# Patient Record
Sex: Female | Born: 1980 | Race: White | Hispanic: No | Smoking: Never smoker
Health system: Southern US, Community
[De-identification: ages and names within clinical notes are randomized; demographics above are authoritative.]

## PROBLEM LIST (undated history)

## (undated) DIAGNOSIS — F32A Depression, unspecified: Secondary | ICD-10-CM

## (undated) DIAGNOSIS — F329 Major depressive disorder, single episode, unspecified: Secondary | ICD-10-CM

## (undated) HISTORY — DX: Depression, unspecified: F32.A

## (undated) HISTORY — DX: Major depressive disorder, single episode, unspecified: F32.9

---

## 2005-10-20 ENCOUNTER — Emergency Department: Payer: Self-pay | Admitting: Emergency Medicine

## 2007-04-18 ENCOUNTER — Emergency Department: Payer: Self-pay | Admitting: Unknown Physician Specialty

## 2007-04-18 ENCOUNTER — Other Ambulatory Visit: Payer: Self-pay

## 2007-06-18 ENCOUNTER — Emergency Department: Payer: Self-pay | Admitting: Emergency Medicine

## 2007-11-28 ENCOUNTER — Ambulatory Visit: Payer: Self-pay | Admitting: Internal Medicine

## 2008-09-15 ENCOUNTER — Emergency Department: Payer: Self-pay | Admitting: Emergency Medicine

## 2008-11-05 ENCOUNTER — Ambulatory Visit: Payer: Self-pay | Admitting: Internal Medicine

## 2008-12-25 ENCOUNTER — Encounter: Payer: Self-pay | Admitting: Obstetrics and Gynecology

## 2009-02-12 LAB — HM HIV SCREENING LAB: HM HIV Screening: NEGATIVE

## 2009-04-02 ENCOUNTER — Ambulatory Visit: Payer: Self-pay | Admitting: Internal Medicine

## 2009-04-24 ENCOUNTER — Ambulatory Visit: Payer: Self-pay | Admitting: Family Medicine

## 2009-05-03 ENCOUNTER — Inpatient Hospital Stay: Payer: Self-pay | Admitting: Obstetrics & Gynecology

## 2009-11-10 ENCOUNTER — Emergency Department: Payer: Self-pay | Admitting: Emergency Medicine

## 2009-12-02 ENCOUNTER — Emergency Department: Payer: Self-pay | Admitting: Emergency Medicine

## 2010-03-27 ENCOUNTER — Emergency Department: Payer: Self-pay | Admitting: Unknown Physician Specialty

## 2010-03-30 IMAGING — US US OB DETAIL+14 WK - NRPT [facility]
1 series · 14 of 28 positions shown · non-contrast
Comparison: none

[Series 1: us ob detail+14 wk - nrpt [facility] · 0.29mm/px · 14 of 57 slices shown]
[im 3/57]
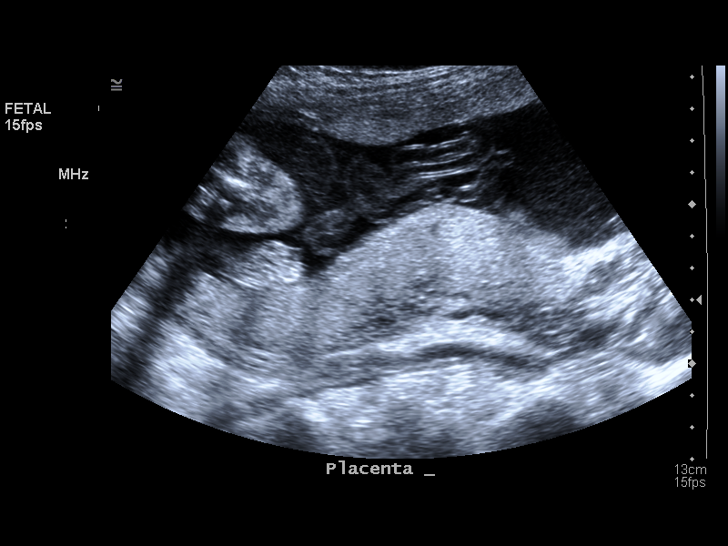
[im 7/57]
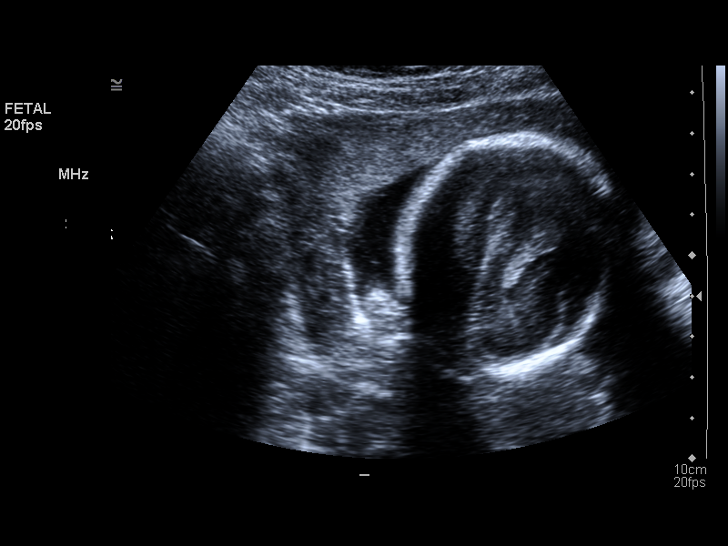
[im 11/57]
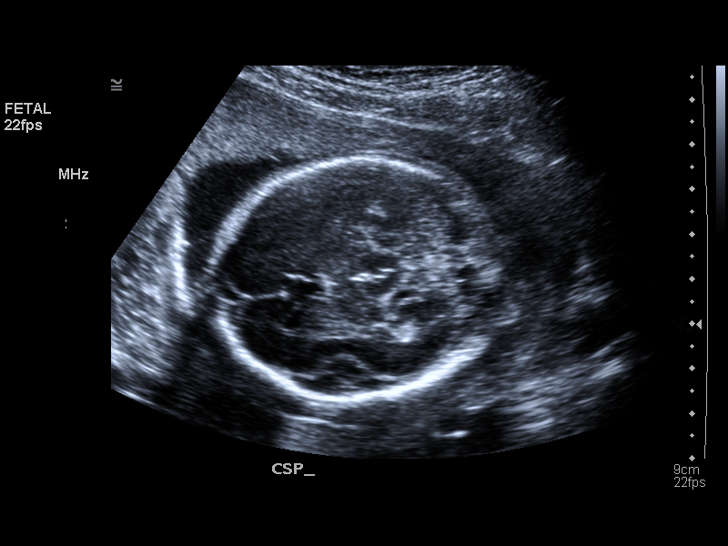
[im 15/57]
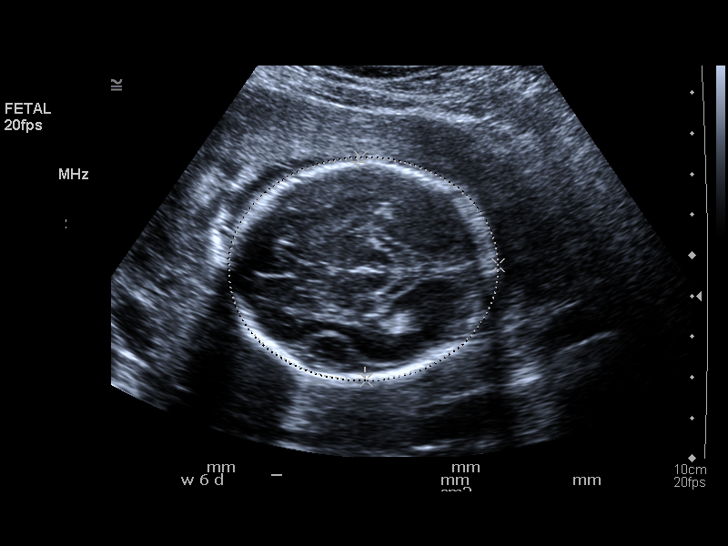
[im 19/57]
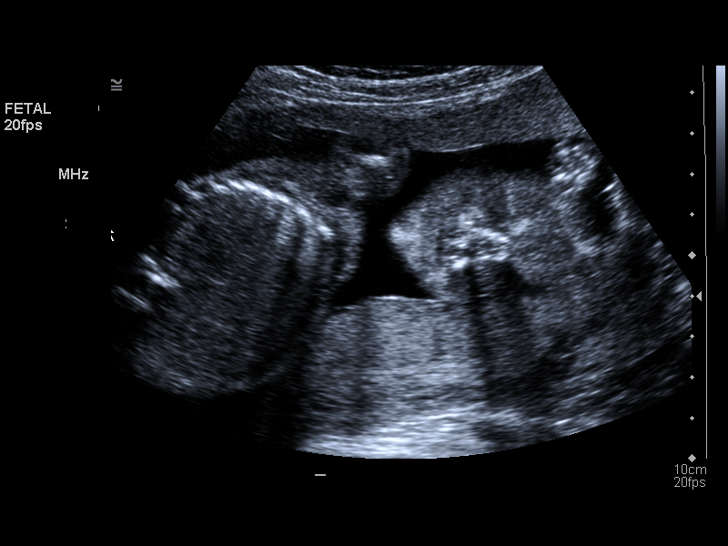
[im 23/57]
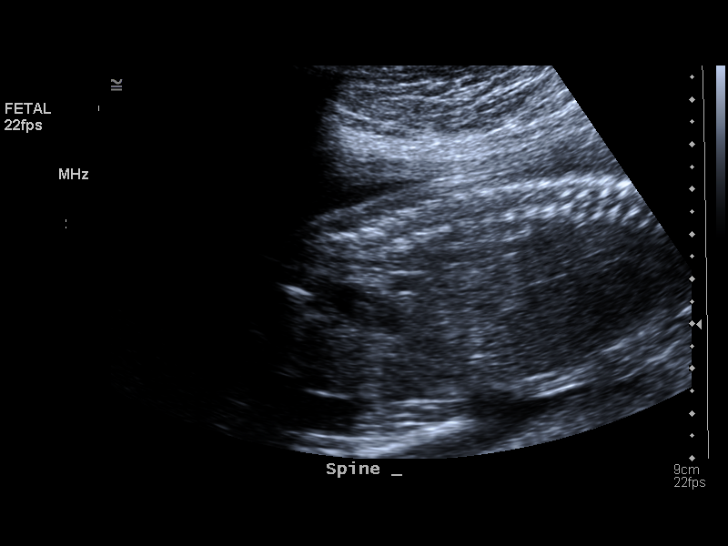
[im 27/57]
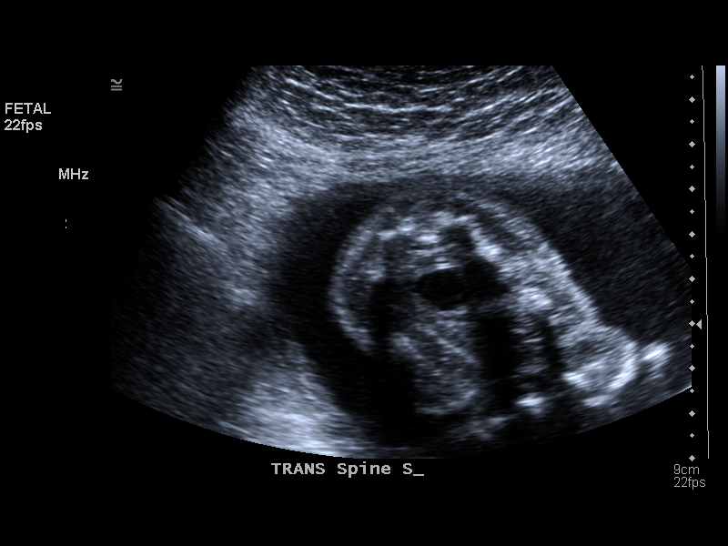
[im 32/57]
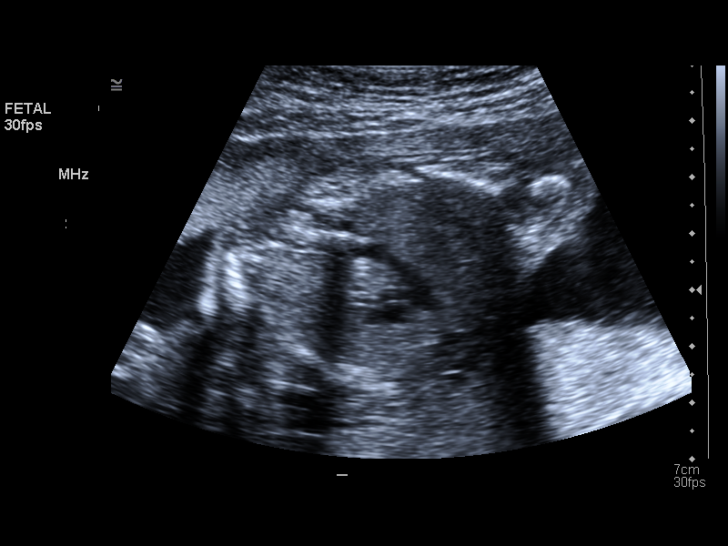
[im 36/57]
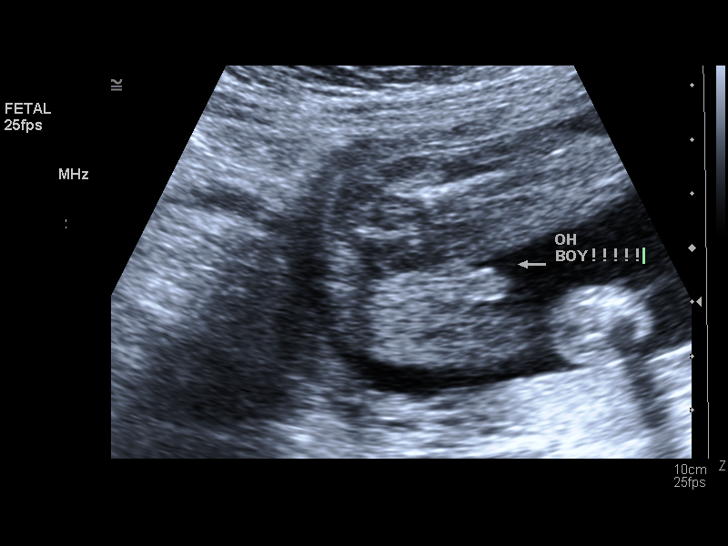
[im 40/57]
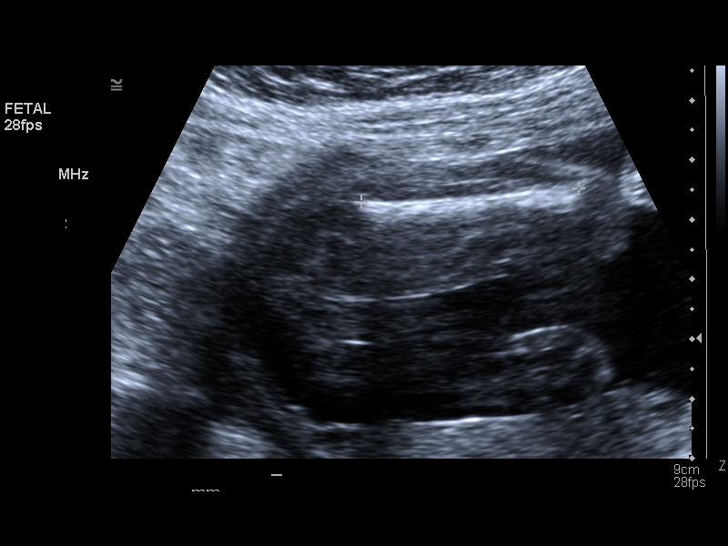
[im 44/57]
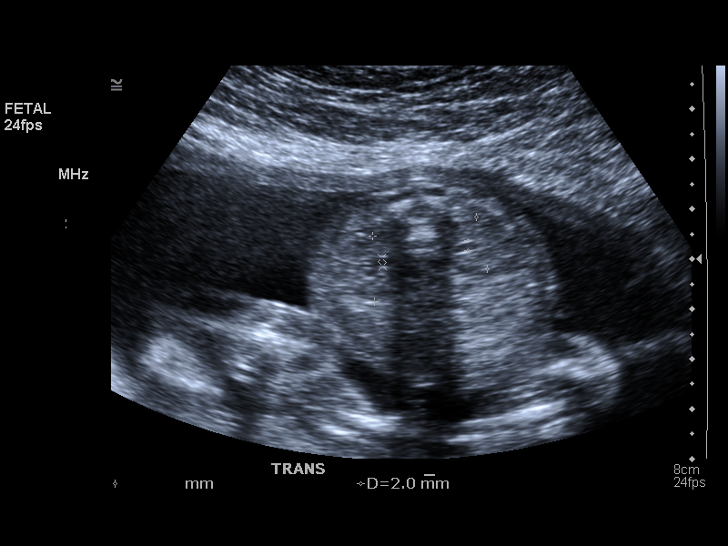
[im 48/57]
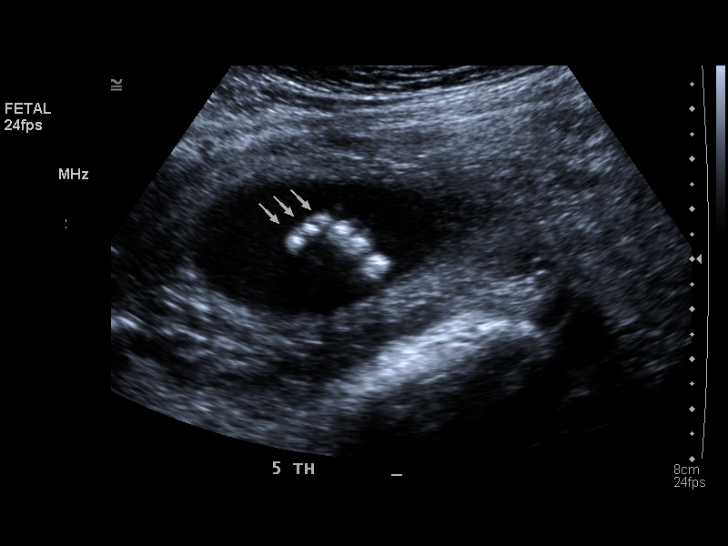
[im 52/57]
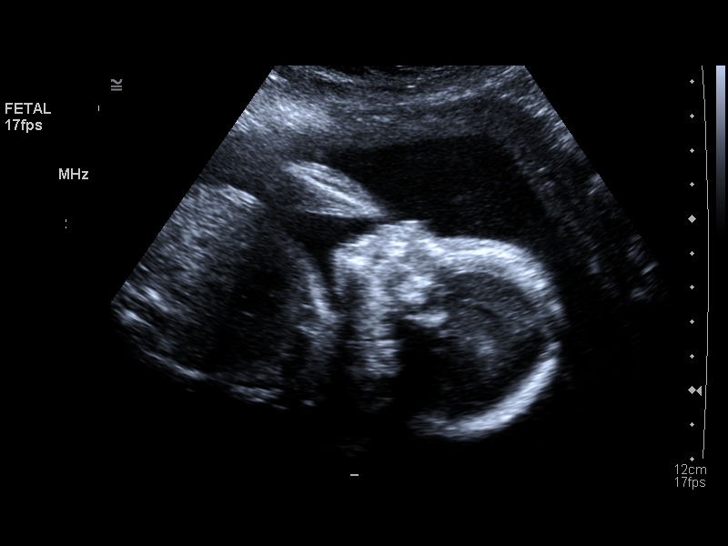
[im 57/57]
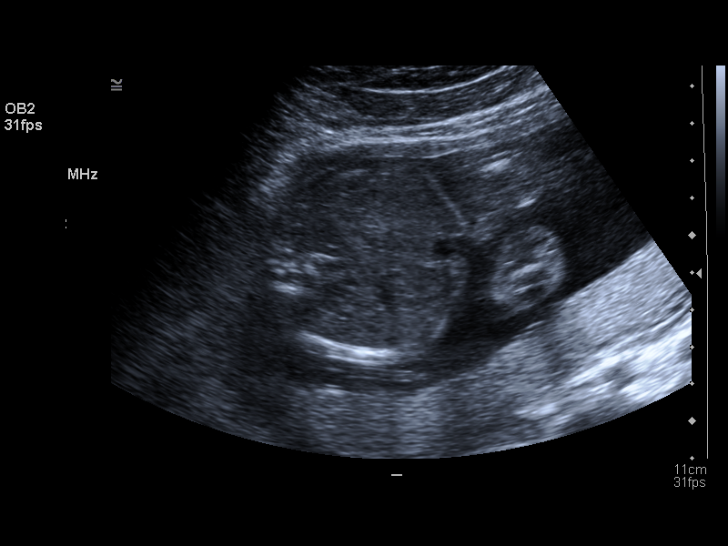

[14 of 28 positions shown; findings below may reference images not displayed]

IMAGES IMPORTED FROM THE SYNGO WORKFLOW SYSTEM
NO DICTATION FOR STUDY

## 2011-04-05 ENCOUNTER — Emergency Department: Payer: Self-pay | Admitting: Internal Medicine

## 2011-11-11 ENCOUNTER — Emergency Department: Payer: Self-pay | Admitting: Internal Medicine

## 2015-08-10 ENCOUNTER — Emergency Department
Admission: EM | Admit: 2015-08-10 | Discharge: 2015-08-10 | Discharge status: Against Medical Advice | Payer: Self-pay | Attending: Emergency Medicine | Admitting: Emergency Medicine

## 2015-08-10 ENCOUNTER — Emergency Department: Payer: Self-pay

## 2015-08-10 DIAGNOSIS — K8020 Calculus of gallbladder without cholecystitis without obstruction: Secondary | ICD-10-CM | POA: Insufficient documentation

## 2015-08-10 DIAGNOSIS — Z3202 Encounter for pregnancy test, result negative: Secondary | ICD-10-CM | POA: Insufficient documentation

## 2015-08-10 DIAGNOSIS — R1011 Right upper quadrant pain: Secondary | ICD-10-CM | POA: Insufficient documentation

## 2015-08-10 LAB — CBC
HEMATOCRIT: 42.4 % (ref 35.0–47.0)
HEMOGLOBIN: 14.1 g/dL (ref 12.0–16.0)
MCH: 28.6 pg (ref 26.0–34.0)
MCHC: 33.3 g/dL (ref 32.0–36.0)
MCV: 86.1 fL (ref 80.0–100.0)
Platelets: 350 10*3/uL (ref 150–440)
RBC: 4.93 MIL/uL (ref 3.80–5.20)
RDW: 13.4 % (ref 11.5–14.5)
WBC: 12.2 10*3/uL — AB (ref 3.6–11.0)

## 2015-08-10 LAB — LIPASE, BLOOD: Lipase: 28 U/L (ref 22–51)

## 2015-08-10 LAB — COMPREHENSIVE METABOLIC PANEL
ALK PHOS: 75 U/L (ref 38–126)
ALT: 34 U/L (ref 14–54)
ANION GAP: 7 (ref 5–15)
AST: 92 U/L — ABNORMAL HIGH (ref 15–41)
Albumin: 4.6 g/dL (ref 3.5–5.0)
BILIRUBIN TOTAL: 1.3 mg/dL — AB (ref 0.3–1.2)
BUN: 11 mg/dL (ref 6–20)
CALCIUM: 9.6 mg/dL (ref 8.9–10.3)
CO2: 27 mmol/L (ref 22–32)
Chloride: 104 mmol/L (ref 101–111)
Creatinine, Ser: 0.58 mg/dL (ref 0.44–1.00)
GFR calc Af Amer: >60 mL/min (ref >60)
Glucose, Bld: 102 mg/dL — ABNORMAL HIGH (ref 65–99)
POTASSIUM: 3.9 mmol/L (ref 3.5–5.1)
Sodium: 138 mmol/L (ref 135–145)
TOTAL PROTEIN: 7.8 g/dL (ref 6.5–8.1)

## 2015-08-10 LAB — URINALYSIS COMPLETE WITH MICROSCOPIC (ARMC ONLY)
Bilirubin Urine: NEGATIVE
GLUCOSE, UA: NEGATIVE mg/dL
KETONES UR: NEGATIVE mg/dL
Leukocytes, UA: NEGATIVE
NITRITE: NEGATIVE
Protein, ur: NEGATIVE mg/dL
SPECIFIC GRAVITY, URINE: 1.017 (ref 1.005–1.030)
pH: 7 (ref 5.0–8.0)

## 2015-08-10 LAB — POCT PREGNANCY, URINE: Preg Test, Ur: NEGATIVE

## 2015-08-10 NOTE — ED Provider Notes (Signed)
[facility] Emergency Department Provider Note  Time seen: 3:42 PM  I have reviewed the triage vital signs and the nursing notes.   HISTORY  Chief Complaint Abdominal Pain    HPI Yvette Wise is a 34 y.o. female with no past medical history of present emergency department right upper quadrant pain. According to the patient yesterday she had right upper quadrant pain after eating a salad. Today she again had right upper quadrant pain lasting approximately 10 minutes after eating lunch. She states approximately 1-2 years ago she had similar symptoms and was diagnosed with gallstones, but had not pursued any further treatment/evaluation. Denies any fever, nausea or vomiting. States the pain is resolved at this time. Describes a pain as moderate, sharp pain when it was occurring. Associated with nausea, which is now resolved.    History reviewed. No pertinent past medical history.  There are no active problems to display for this patient.   History reviewed. No pertinent past surgical history.  No current outpatient prescriptions on file.  Allergies Review of patient's allergies indicates no known allergies.  No family history on file.  Social History Social History  Substance Use Topics  . Smoking status: Never Smoker   . Smokeless tobacco: Never Used  . Alcohol Use: No    Review of Systems Constitutional: Negative for fever. Cardiovascular: Negative for chest pain. Respiratory: Negative for shortness of breath. Gastrointestinal: Positive right upper quadrant abdominal pain, now resolved. Genitourinary: Negative for dysuria. Musculoskeletal: Felt some pain radiating to the right back. Also now resolved. Neurological: Negative for headache 10-point ROS otherwise negative.  ____________________________________________   PHYSICAL EXAM:  VITAL SIGNS: ED Triage Vitals  Enc Vitals Group     BP 08/10/15 1347 121/79 mmHg     Pulse Rate  08/10/15 1347 96     Resp 08/10/15 1347 16     Temp 08/10/15 1347 98.4 F (36.9 C)     Temp Source 08/10/15 1347 Oral     SpO2 08/10/15 1347 100 %     Weight 08/10/15 1347 123 lb (55.792 kg)     Height 08/10/15 1347  (1.575 m)     Head Cir --      Peak Flow --      Pain Score --      Pain Loc --      Pain Edu? --      Excl. in GC? --     Constitutional: Alert and oriented. Well appearing and in no distress. Eyes: Normal exam ENT   Mouth/Throat: Mucous membranes are moist. Cardiovascular: Normal rate, regular rhythm. No murmur Respiratory: Normal respiratory effort without tachypnea nor retractions. Breath sounds are clear and equal bilaterally. No wheezes/rales/rhonchi. Gastrointestinal: Soft and nontender. No distention.   Musculoskeletal: Nontender with normal range of motion in all extremities. Neurologic:  Normal speech and language. No gross focal neurologic deficits  Skin:  Skin is warm, dry and intact.  Psychiatric: Mood and affect are normal. Speech and behavior are normal. ____________________________________________   RADIOLOGY  Ultrasound not performed.  ____________________________________________   INITIAL IMPRESSION / ASSESSMENT AND PLAN / ED COURSE  Pertinent labs & imaging results that were available during my care of the patient were reviewed by me and considered in my medical decision making (see chart for details).  Patient with right upper quadrant ultrasound 2 days. States similar pain several years ago and was told it was gallstones. Patient with likely biliary colic, we'll perform an ultrasound to evaluate.  Patient's labs show a mild white blood cell count elevation, as well as a mild AST elevation.   ----------------------------------------- 4:42 PM on 08/10/2015 -----------------------------------------  Patient has eloped from the emergency department for unknown reasons, prior to ultrasound being  completed.  ____________________________________________   FINAL CLINICAL IMPRESSION(S) / ED DIAGNOSES  Right upper quadrant abdominal pain biliary colic   Minna Antis, MD 08/10/15 [phone]

## 2015-08-10 NOTE — ED Notes (Signed)
Pt c/o epigastric pain that radiates into the back lasting approximately today after eating a bowl of cereal, states the same sx happened yesterday evening..states she had similar sx several years ago and was told it was gall stones but never had surgery.Marland Kitchen

## 2015-08-13 ENCOUNTER — Emergency Department: Payer: Self-pay

## 2015-08-13 ENCOUNTER — Emergency Department
Admission: EM | Admit: 2015-08-13 | Discharge: 2015-08-13 | Disposition: A | Discharge status: Home / Self Care | Payer: Self-pay | Attending: Emergency Medicine | Admitting: Emergency Medicine

## 2015-08-13 ENCOUNTER — Encounter: Payer: Self-pay | Admitting: Emergency Medicine

## 2015-08-13 DIAGNOSIS — Z793 Long term (current) use of hormonal contraceptives: Secondary | ICD-10-CM | POA: Insufficient documentation

## 2015-08-13 DIAGNOSIS — K8020 Calculus of gallbladder without cholecystitis without obstruction: Secondary | ICD-10-CM | POA: Insufficient documentation

## 2015-08-13 DIAGNOSIS — R1011 Right upper quadrant pain: Secondary | ICD-10-CM

## 2015-08-13 LAB — CBC WITH DIFFERENTIAL/PLATELET
BASOS ABS: 0.1 10*3/uL (ref 0–0.1)
BASOS PCT: 1 %
EOS ABS: 0 10*3/uL (ref 0–0.7)
Eosinophils Relative: 0 %
HEMATOCRIT: 39 % (ref 35.0–47.0)
Hemoglobin: 13.3 g/dL (ref 12.0–16.0)
Lymphocytes Relative: 22 %
Lymphs Abs: 2.3 10*3/uL (ref 1.0–3.6)
MCH: 28.7 pg (ref 26.0–34.0)
MCHC: 34.1 g/dL (ref 32.0–36.0)
MCV: 84.2 fL (ref 80.0–100.0)
MONO ABS: 0.8 10*3/uL (ref 0.2–0.9)
MONOS PCT: 8 %
NEUTROS ABS: 7.3 10*3/uL — AB (ref 1.4–6.5)
Neutrophils Relative %: 69 %
PLATELETS: 337 10*3/uL (ref 150–440)
RBC: 4.64 MIL/uL (ref 3.80–5.20)
RDW: 13.4 % (ref 11.5–14.5)
WBC: 10.6 10*3/uL (ref 3.6–11.0)

## 2015-08-13 LAB — BASIC METABOLIC PANEL
ANION GAP: 11 (ref 5–15)
BUN: 13 mg/dL (ref 6–20)
CALCIUM: 9.3 mg/dL (ref 8.9–10.3)
CO2: 24 mmol/L (ref 22–32)
CREATININE: 0.7 mg/dL (ref 0.44–1.00)
Chloride: 101 mmol/L (ref 101–111)
Glucose, Bld: 91 mg/dL (ref 65–99)
Potassium: 3.4 mmol/L — ABNORMAL LOW (ref 3.5–5.1)
SODIUM: 136 mmol/L (ref 135–145)

## 2015-08-13 LAB — URINALYSIS COMPLETE WITH MICROSCOPIC (ARMC ONLY)
BILIRUBIN URINE: NEGATIVE
GLUCOSE, UA: NEGATIVE mg/dL
NITRITE: NEGATIVE
PROTEIN: NEGATIVE mg/dL
SPECIFIC GRAVITY, URINE: 1.023 (ref 1.005–1.030)
pH: 5 (ref 5.0–8.0)

## 2015-08-13 LAB — LIPASE, BLOOD: LIPASE: 24 U/L (ref 22–51)

## 2015-08-13 MED ORDER — OXYCODONE-ACETAMINOPHEN 5-325 MG PO TABS: 1.0000 | ORAL_TABLET | ORAL | Status: DC | PRN

## 2015-08-13 MED ORDER — ONDANSETRON HCL 4 MG/2ML IJ SOLN
4.0000 mg | Freq: Once | INTRAMUSCULAR | Status: AC
  Administered 2015-08-13: 4 mg via INTRAVENOUS
  Filled 2015-08-13: qty 2

## 2015-08-13 MED ORDER — MORPHINE SULFATE (PF) 2 MG/ML IV SOLN
2.0000 mg | Freq: Once | INTRAVENOUS | Status: AC
  Administered 2015-08-13: 2 mg via INTRAVENOUS
  Filled 2015-08-13: qty 1

## 2015-08-13 MED ORDER — ONDANSETRON 4 MG PO TBDP: 4.0000 mg | ORAL_TABLET | Freq: Three times a day (TID) | ORAL | Status: DC | PRN

## 2015-08-13 NOTE — ED Notes (Signed)
Pt presents to ED with epigastric discomfort that radiates through to her back and nausea. Has hx of gallstones several years ago that did not require surgery. Pt alert and calm with no increased work of breathing noted at this time.

## 2015-08-13 NOTE — Discharge Instructions (Signed)
Cholelithiasis °Cholelithiasis (also called gallstones) is a form of gallbladder disease in which gallstones form in your gallbladder. The gallbladder is an organ that stores bile made in the liver, which helps digest fats. Gallstones begin as small crystals and slowly grow into stones. Gallstone pain occurs when the gallbladder spasms and a gallstone is blocking the duct. Pain can also occur when a stone passes out of the duct.  °RISK FACTORS °· Being female.   °· Having multiple pregnancies. Health care providers sometimes advise removing diseased gallbladders before future pregnancies.   °· Being obese. °· Eating a diet heavy in fried foods and fat.   °· Being older than 60 years and increasing age.   °· Prolonged use of medicines containing female hormones.   °· Having diabetes mellitus.   °· Rapidly losing weight.   °· Having a family history of gallstones (heredity).   °SYMPTOMS °· Nausea.   °· Vomiting. °· Abdominal pain.   °· Yellowing of the skin (jaundice).   °· Sudden pain. It may persist from several minutes to several hours. °· Fever.   °· Tenderness to the touch.  °In some cases, when gallstones do not move into the bile duct, people have no pain or symptoms. These are called "silent" gallstones.  °TREATMENT °Silent gallstones do not need treatment. In severe cases, emergency surgery may be required. Options for treatment include: °· Surgery to remove the gallbladder. This is the most common treatment. °· Medicines. These do not always work and may take 6-12 months or more to work. °· Shock wave treatment (extracorporeal biliary lithotripsy). In this treatment an ultrasound machine sends shock waves to the gallbladder to break gallstones into smaller pieces that can pass into the intestines or be dissolved by medicine. °HOME CARE INSTRUCTIONS  °· Only take over-the-counter or prescription medicines for pain, discomfort, or fever as directed by your health care provider.   °· Follow a low-fat diet until  seen again by your health care provider. Fat causes the gallbladder to contract, which can result in pain.   °· Follow up with your health care provider as directed. Attacks are almost always recurrent and surgery is usually required for permanent treatment.   °SEEK IMMEDIATE MEDICAL CARE IF:  °· Your pain increases and is not controlled by medicines.   °· You have a fever or persistent symptoms for more than 2-3 days.   °· You have a fever and your symptoms suddenly get worse.   °· You have persistent nausea and vomiting.   °MAKE SURE YOU:  °· Understand these instructions. °· Will watch your condition. °· Will get help right away if you are not doing well or get worse. °Document Released: 12/01/2005 Document Revised: 08/07/2013 Document Reviewed: 05/29/2013 °ExitCare® Patient Information ©2015 [employer]. This information is not intended to replace advice given to you by your health care provider. Make sure you discuss any questions you have with your health care provider. ° °

## 2015-08-13 NOTE — ED Provider Notes (Signed)
[facility] Emergency Department Provider Note  ____________________________________________  Time seen:   I have reviewed the triage vital signs and the nursing notes.   HISTORY  Chief Complaint Abdominal Pain and Nausea     HPI Yvette Wise is a 34 y.o. female resents with epigastric/right upper quadrant pain with onset at 7:30 PM. . Patient also admits to nausea. Of note patient has a history of gallstones that she was diagnosed which she stated years ago but never had any intervention performed. Patient denies any fever current pain score is 7 out of 10.     History reviewed. No pertinent past medical history.  There are no active problems to display for this patient.   History reviewed. No pertinent past surgical history.  Current Outpatient Rx  Name  Route  Sig  Dispense  Refill  . norethindrone (MICRONOR,CAMILA,ERRIN) 0.35 MG tablet   Oral   Take 1 tablet by mouth daily.           Allergies Review of patient's allergies indicates no known allergies.  No family history on file.  Social History Social History  Substance Use Topics  . Smoking status: Never Smoker   . Smokeless tobacco: Never Used  . Alcohol Use: No    Review of Systems  Constitutional: Negative for fever. Eyes: Negative for visual changes. ENT: Negative for sore throat. Cardiovascular: Negative for chest pain. Respiratory: Negative for shortness of breath. Gastrointestinal: Positive for abdominal pain and vomiting. Genitourinary: Negative for dysuria. Musculoskeletal: Negative for back pain. Skin: Negative for rash. Neurological: Negative for headaches, focal weakness or numbness.   10-point ROS otherwise negative.  ____________________________________________   PHYSICAL EXAM:  VITAL SIGNS: ED Triage Vitals  Enc Vitals Group     BP 08/13/15 0015 132/79 mmHg     Pulse Rate 08/13/15 0015 98     Resp 08/13/15 0015 18     Temp 08/13/15 0015 98.1  F (36.7 C)     Temp Source 08/13/15 0015 Oral     SpO2 08/13/15 0015 100 %     Weight 08/13/15 0015 123 lb (55.792 kg)     Height 08/13/15 0015 5\' 2"  (1.575 m)     Head Cir --      Peak Flow --      Pain Score 08/13/15 0016 8     Pain Loc --      Pain Edu? --      Excl. in GC? --      Constitutional: Alert and oriented. Well appearing and in no distress. Eyes: Conjunctivae are normal. PERRL. Normal extraocular movements. ENT   Head: Normocephalic and atraumatic.   Nose: No congestion/rhinnorhea.   Mouth/Throat: Mucous membranes are moist.   Neck: No stridor. Cardiovascular: Normal rate, regular rhythm. Normal and symmetric distal pulses are present in all extremities. No murmurs, rubs, or gallops. Respiratory: Normal respiratory effort without tachypnea nor retractions. Breath sounds are clear and equal bilaterally. No wheezes/rales/rhonchi. Gastrointestinal: Tender to palpation right upper quadrant/epigastric.Marland Kitchen No distention. There is no CVA tenderness. Genitourinary: deferred Musculoskeletal: Nontender with normal range of motion in all extremities. No joint effusions.  No lower extremity tenderness nor edema. Neurologic:  Normal speech and language. No gross focal neurologic deficits are appreciated. Speech is normal.  Skin:  Skin is warm, dry and intact. No rash noted. Psychiatric: Mood and affect are normal. Speech and behavior are normal. Patient exhibits appropriate insight and judgment.  ____________________________________________    LABS (pertinent positives/negatives)  Labs  Reviewed  CBC WITH DIFFERENTIAL/PLATELET - Abnormal; Notable for the following:    Neutro Abs 7.3 (*)    All other components within normal limits  BASIC METABOLIC PANEL - Abnormal; Notable for the following:    Potassium 3.4 (*)    All other components within normal limits  URINALYSIS COMPLETEWITH MICROSCOPIC ([facility] ONLY) - Abnormal; Notable for the following:    Color, Urine  AMBER (*)    APPearance CLEAR (*)    Ketones, ur 2+ (*)    Hgb urine dipstick 2+ (*)    Leukocytes, UA TRACE (*)    Bacteria, UA RARE (*)    Squamous Epithelial / LPF 0-5 (*)    All other components within normal limits  LIPASE, BLOOD         RADIOLOGY US Abdomen Limited RUQ (Final result) Result time: 08/13/15 01:56:42   Procedure changed from US Abdomen Limited      Final result by Rad Results In Interface (08/13/15 01:56:42)   Narrative:   CLINICAL DATA: Right upper quadrant and epigastric pain.  EXAM: US ABDOMEN LIMITED - RIGHT UPPER QUADRANT  COMPARISON: None.  FINDINGS: Gallbladder:  Multiple stones in the gallbladder, measuring up to about 1.5 cm diameter. No gallbladder wall thickening. Murphy's sign is negative.  Common bile duct:  Diameter: 5.4 mm, normal  Liver:  No focal lesion identified. Within normal limits in parenchymal echogenicity.  IMPRESSION: Cholelithiasis. No additional inflammatory changes.   Electronically Signed By: Burman Nieves M.D. On: 08/13/2015 01:56           INITIAL IMPRESSION / ASSESSMENT AND PLAN / ED COURSE  Pertinent labs & imaging results that were available during my care of the patient were reviewed by me and considered in my medical decision making (see chart for details).  Patient received morphine and Zofran for analgesia. Patient referred to Dr. Michela Pitcher for outpatient management of cholelithiasis.  ____________________________________________   FINAL CLINICAL IMPRESSION(S) / ED DIAGNOSES  Final diagnoses:  Calculus of gallbladder without cholecystitis without obstruction      Darci Current, MD 08/13/15 [phone]

## 2015-08-13 NOTE — ED Notes (Signed)
Patient transported to Ultrasound 

## 2016-04-04 ENCOUNTER — Encounter: Payer: Self-pay | Admitting: Internal Medicine

## 2016-04-04 ENCOUNTER — Ambulatory Visit (INDEPENDENT_AMBULATORY_CARE_PROVIDER_SITE_OTHER): Payer: 59 | Admitting: Internal Medicine

## 2016-04-04 VITALS — BP 112/70 | HR 88 | Ht 62.0 in | Wt 118.6 lb

## 2016-04-04 DIAGNOSIS — H6981 Other specified disorders of Eustachian tube, right ear: Secondary | ICD-10-CM

## 2016-04-04 DIAGNOSIS — H6991 Unspecified Eustachian tube disorder, right ear: Secondary | ICD-10-CM

## 2016-04-04 DIAGNOSIS — F411 Generalized anxiety disorder: Secondary | ICD-10-CM

## 2016-04-04 DIAGNOSIS — K8010 Calculus of gallbladder with chronic cholecystitis without obstruction: Secondary | ICD-10-CM | POA: Diagnosis not present

## 2016-04-04 MED ORDER — CITALOPRAM HYDROBROMIDE 20 MG PO TABS: 20.0000 mg | ORAL_TABLET | Freq: Every day | ORAL | Status: DC

## 2016-04-04 NOTE — Progress Notes (Signed)
Date:  04/04/2016   Name:  Yvette Wise   DOB:  08/29/1981   MRN:  [id number]  New patient to establish care.  She goes to the HD for Pap smears and OCPs.  She had a bout of gall stones last year but declined surgery at that time.  No other chronic medical problems.  Chief Complaint: New Evaluation and Ear Pain Otalgia  There is pain in the right ear. This is a new problem. The current episode started yesterday. The problem occurs every few minutes. The problem has been unchanged. There has been no fever. Associated symptoms include abdominal pain. Pertinent negatives include no coughing, diarrhea, headaches or vomiting.  Abdominal Pain This is a recurrent problem. The current episode started more than 1 year ago. The problem occurs intermittently. The problem has been waxing and waning. The pain is mild. The quality of the pain is colicky. The abdominal pain radiates to the epigastric region. Pertinent negatives include no arthralgias, constipation, diarrhea, fever, headaches or vomiting. She has tried nothing for the symptoms. Prior workup: known gallstones but she has been too afraid to have surgery.  Anxiety Presents for initial visit. Onset was at an unknown time. The problem has been gradually worsening. Symptoms include excessive worry and nervous/anxious behavior. Patient reports no chest pain, depressed mood, dizziness, palpitations, shortness of breath or suicidal ideas. The severity of symptoms is moderate.   Past treatments include nothing.     Review of Systems  Constitutional: Negative for fever, chills and fatigue.  HENT: Positive for ear pain and postnasal drip.   Eyes: Negative for visual disturbance.  Respiratory: Negative for cough, chest tightness and shortness of breath.   Cardiovascular: Negative for chest pain, palpitations and leg swelling.  Gastrointestinal: Positive for abdominal pain and abdominal distention. Negative for vomiting, diarrhea, constipation and  anal bleeding.  Musculoskeletal: Negative for arthralgias.  Neurological: Negative for dizziness, tremors, syncope and headaches.  Psychiatric/Behavioral: Negative for suicidal ideas, sleep disturbance and dysphoric mood. The patient is nervous/anxious.     There are no active problems to display for this patient.   Prior to Admission medications   Medication Sig Start Date End Date Taking? Authorizing Provider  norethindrone (MICRONOR,CAMILA,ERRIN) 0.35 MG tablet Take 1 tablet by mouth daily.   Yes Historical Provider, MD  oxyCODONE-acetaminophen (ROXICET) 5-325 MG per tablet Take 1 tablet by mouth every 4 (four) hours as needed for severe pain. 08/13/15  Yes Darci Currentandolph N Brown, MD    No Known Allergies  Past Surgical History  Procedure Laterality Date  . No past surgeries      Social History  Substance Use Topics  . Smoking status: Never Smoker   . Smokeless tobacco: Never Used  . Alcohol Use: No    Medication list has been reviewed and updated.   Physical Exam  Constitutional: She is oriented to person, place, and time. She appears well-developed. No distress.  HENT:  Head: Normocephalic and atraumatic.  Right Ear: Ear canal normal. Tympanic membrane is retracted. Tympanic membrane is not erythematous.  Left Ear: Tympanic membrane and ear canal normal. Tympanic membrane is not erythematous and not retracted.  Mouth/Throat: Oropharynx is clear and moist. No tonsillar abscesses (tonsils 3+ size).  Cardiovascular: Normal rate, regular rhythm and normal heart sounds.   Pulmonary/Chest: Effort normal and breath sounds normal. No respiratory distress. She has no wheezes. She has no rales.  Abdominal: Soft. Normal appearance and bowel sounds are normal. There is tenderness in the epigastric  area. There is no rigidity, no guarding and no CVA tenderness.  Musculoskeletal: Normal range of motion.  Lymphadenopathy:    She has no cervical adenopathy.  Neurological: She is alert and  oriented to person, place, and time.  Skin: Skin is warm and dry. No rash noted.  Psychiatric: She has a normal mood and affect. Her speech is normal and behavior is normal. Thought content normal.  Nursing note and vitals reviewed.   BP 112/70 mmHg  Pulse 88  Ht  (1.575 m)  Wt 118 lb 9.6 oz (53.797 kg)  BMI 21.69 kg/m2  LMP 03/14/2016 (Approximate)  Assessment and Plan: 1. Generalized anxiety disorder Begin Celexa - return for follow up in 6 weeks if dose adjustment needed - citalopram (CELEXA) 20 MG tablet; Take 1 tablet (20 mg total) by mouth daily.  Dispense: 30 tablet; Refill: 3  2. Chronic calculous cholecystitis Recommend elective surgery  3. Eustachian tube dysfunction, right Sudafed 30 mg bid as needed   Bari Edward, MD [facility]  04/04/2016

## 2016-04-04 NOTE — Patient Instructions (Signed)
Breast Self-Awareness Practicing breast self-awareness may pick up problems early, prevent significant medical complications, and possibly save your life. By practicing breast self-awareness, you can become familiar with how your breasts look and feel and if your breasts are changing. This allows you to notice changes early. It can also offer you some reassurance that your breast health is good. One way to learn what is normal for your breasts and whether your breasts are changing is to do a breast self-exam. If you find a lump or something that was not present in the past, it is best to contact your caregiver right away. Other findings that should be evaluated by your caregiver include nipple discharge, especially if it is bloody; skin changes or reddening; areas where the skin seems to be pulled in (retracted); or new lumps and bumps. Breast pain is seldom associated with cancer (malignancy), but should also be evaluated by a caregiver. HOW TO PERFORM A BREAST SELF-EXAM The best time to examine your breasts is 5-7 days after your menstrual period is over. During menstruation, the breasts are lumpier, and it may be more difficult to pick up changes. If you do not menstruate, have reached menopause, or had your uterus removed (hysterectomy), you should examine your breasts at regular intervals, such as monthly. If you are breastfeeding, examine your breasts after a feeding or after using a breast pump. Breast implants do not decrease the risk for lumps or tumors, so continue to perform breast self-exams as recommended. Talk to your caregiver about how to determine the difference between the implant and breast tissue. Also, talk about the amount of pressure you should use during the exam. Over time, you will become more familiar with the variations of your breasts and more comfortable with the exam. A breast self-exam requires you to remove all your clothes above the waist. 1. Look at your breasts and nipples.  Stand in front of a mirror in a room with good lighting. With your hands on your hips, push your hands firmly downward. Look for a difference in shape, contour, and size from one breast to the other (asymmetry). Asymmetry includes puckers, dips, or bumps. Also, look for skin changes, such as reddened or scaly areas on the breasts. Look for nipple changes, such as discharge, dimpling, repositioning, or redness. 2. Carefully feel your breasts. This is best done either in the shower or tub while using soapy water or when flat on your back. Place the arm (on the side of the breast you are examining) above your head. Use the pads (not the fingertips) of your three middle fingers on your opposite hand to feel your breasts. Start in the underarm area and use  inch (2 cm) overlapping circles to feel your breast. Use 3 different levels of pressure (light, medium, and firm pressure) at each circle before moving to the next circle. The light pressure is needed to feel the tissue closest to the skin. The medium pressure will help to feel breast tissue a little deeper, while the firm pressure is needed to feel the tissue close to the ribs. Continue the overlapping circles, moving downward over the breast until you feel your ribs below your breast. Then, move one finger-width towards the center of the body. Continue to use the  inch (2 cm) overlapping circles to feel your breast as you move slowly up toward the collar bone (clavicle) near the base of the neck. Continue the up and down exam using all 3 pressures until you reach the   middle of the chest. Do this with each breast, carefully feeling for lumps or changes. 3.  Keep a written record with breast changes or normal findings for each breast. By writing this information down, you do not need to depend only on memory for size, tenderness, or location. Write down where you are in your menstrual cycle, if you are still menstruating. Breast tissue can have some lumps or  thick tissue. However, see your caregiver if you find anything that concerns you.  SEEK MEDICAL CARE IF:  You see a change in shape, contour, or size of your breasts or nipples.   You see skin changes, such as reddened or scaly areas on the breasts or nipples.   You have an unusual discharge from your nipples.   You feel a new lump or unusually thick areas.    This information is not intended to replace advice given to you by your health care provider. Make sure you discuss any questions you have with your health care provider.   Document Released: 12/05/2005 Document Revised: 11/21/2012 Document Reviewed: 03/21/2012 Elsevier Interactive Patient Education 2016 [employer]  

## 2016-04-11 ENCOUNTER — Other Ambulatory Visit: Payer: Self-pay

## 2016-04-19 ENCOUNTER — Telehealth: Payer: Self-pay

## 2016-04-19 NOTE — Telephone Encounter (Signed)
Has been a week with no meds after you asked her to stop Celexa. She said you asked her to call in a week for a new Rx for something different.

## 2016-04-20 ENCOUNTER — Other Ambulatory Visit: Payer: Self-pay | Admitting: Internal Medicine

## 2016-04-20 MED ORDER — BUPROPION HCL ER (XL) 150 MG PO TB24: 150.0000 mg | ORAL_TABLET | Freq: Every day | ORAL | Status: DC

## 2016-04-20 NOTE — Telephone Encounter (Signed)
[facility]

## 2016-05-02 ENCOUNTER — Telehealth: Payer: Self-pay

## 2016-05-02 NOTE — Telephone Encounter (Signed)
Patient having elevated BP on Wellbutrin. Would like new Rx for something she can tolerate.

## 2016-05-02 NOTE — Telephone Encounter (Signed)
How high is her blood pressure? I can not guarantee that she will tolerate any medication - maybe she should come in for a blood pressure check here to be sure it's too high to continue Wellbutrin.

## 2016-05-02 NOTE — Telephone Encounter (Signed)
Making appointment

## 2016-05-03 ENCOUNTER — Ambulatory Visit (INDEPENDENT_AMBULATORY_CARE_PROVIDER_SITE_OTHER): Payer: 59 | Admitting: Internal Medicine

## 2016-05-03 ENCOUNTER — Encounter: Payer: Self-pay | Admitting: Internal Medicine

## 2016-05-03 VITALS — BP 112/68 | HR 94 | Resp 16 | Ht 62.0 in | Wt 115.8 lb

## 2016-05-03 DIAGNOSIS — F411 Generalized anxiety disorder: Secondary | ICD-10-CM | POA: Diagnosis not present

## 2016-05-03 MED ORDER — ESCITALOPRAM OXALATE 10 MG PO TABS: 10.0000 mg | ORAL_TABLET | Freq: Every day | ORAL | Status: DC

## 2016-05-03 NOTE — Progress Notes (Signed)
Date:  05/03/2016   Name:  Yvette Wise   DOB:  1981-06-20   MRN:  [id number]   Chief Complaint: Depression Depression        Associated symptoms include no fatigue, no headaches and no suicidal ideas. Patient was seen several weeks ago for depression and anxiety symptoms. She started Celexa but this caused stomach pain similar to her gallbladder attacks as well as diarrhea. She is only able to take it for about 4 days and had discontinued. After she recovered from those symptoms she began Wellbutrin. Every day she took the Wellbutrin she had palpitations felt flushed and lightheaded. He was only able to try for 4 days and has been off of it for the past week. She felt that her blood pressure was also elevated but did not measure it. Currently all of these side effects have resolved. However, she continues to have anxiety symptoms excessive worry and mild depression.   Review of Systems  Constitutional: Negative for fever, chills and fatigue.  Eyes: Negative for visual disturbance.  Respiratory: Negative for cough, chest tightness and shortness of breath.   Cardiovascular: Negative for chest pain, palpitations and leg swelling.  Gastrointestinal: Negative for vomiting, diarrhea, constipation, abdominal distention and anal bleeding. Abdominal pain: intermittent.  Musculoskeletal: Negative for arthralgias.  Neurological: Negative for dizziness, tremors, syncope and headaches.  Psychiatric/Behavioral: Positive for depression and dysphoric mood. Negative for suicidal ideas and sleep disturbance. The patient is nervous/anxious.     Patient Active Problem List   Diagnosis Date Noted  . Generalized anxiety disorder 05/03/2016    Prior to Admission medications   Medication Sig Start Date End Date Taking? Authorizing Provider  norethindrone (MICRONOR,CAMILA,ERRIN) 0.35 MG tablet Take 1 tablet by mouth daily.   Yes Historical Provider, MD  oxyCODONE-acetaminophen (ROXICET) 5-325 MG per  tablet Take 1 tablet by mouth every 4 (four) hours as needed for severe pain. 08/13/15  Yes Darci Current, MD  buPROPion (WELLBUTRIN XL) 150 MG 24 hr tablet Take 1 tablet (150 mg total) by mouth daily. Patient not taking: Reported on 05/03/2016 04/20/16   Reubin Milan, MD    Allergies  Allergen Reactions  . Celexa [Citalopram] Other (See Comments)    Severe nausea, vomiting, diarrhea, chills and fatigue    Past Surgical History  Procedure Laterality Date  . No past surgeries      Social History  Substance Use Topics  . Smoking status: Never Smoker   . Smokeless tobacco: Never Used  . Alcohol Use: No    Medication list has been reviewed and updated.   Physical Exam  Constitutional: She is oriented to person, place, and time. She appears well-developed. No distress.  HENT:  Head: Normocephalic and atraumatic.  Cardiovascular: Normal rate, regular rhythm and normal heart sounds.   Pulmonary/Chest: Effort normal and breath sounds normal. No respiratory distress. She has no wheezes. She has no rales.  Abdominal: There is no rigidity, no guarding and no CVA tenderness.  Musculoskeletal: Normal range of motion.  Lymphadenopathy:    She has no cervical adenopathy.  Neurological: She is alert and oriented to person, place, and time.  Skin: Skin is warm and dry. No rash noted.  Psychiatric: She has a normal mood and affect. Her speech is normal and behavior is normal. Thought content normal.  Nursing note and vitals reviewed.   BP 112/68 mmHg  Pulse 94  Resp 16  Ht  (1.575 m)  Wt 115 lb 12.8 oz (  52.527 kg)  BMI 21.17 kg/m2  SpO2 99%  LMP 03/14/2016 (Approximate)  Assessment and Plan: 1. Generalized anxiety disorder Start slowly at 2.5 - 5 mg per day for one week then increase to 10 mg per day Call if unable to tolerate - escitalopram (LEXAPRO) 10 MG tablet; Take 1 tablet (10 mg total) by mouth daily.  Dispense: 30 tablet; Refill: 5   Bari EdwardLaura Shanesha Bednarz, MD [facility]  05/03/2016

## 2016-05-06 ENCOUNTER — Other Ambulatory Visit: Payer: Self-pay | Admitting: Internal Medicine

## 2016-05-06 DIAGNOSIS — F411 Generalized anxiety disorder: Secondary | ICD-10-CM

## 2016-05-06 MED ORDER — ESCITALOPRAM OXALATE 10 MG PO TABS: 10.0000 mg | ORAL_TABLET | Freq: Every day | ORAL | Status: DC

## 2016-05-19 ENCOUNTER — Other Ambulatory Visit: Payer: Self-pay

## 2016-05-19 ENCOUNTER — Encounter: Payer: Self-pay | Admitting: Surgery

## 2016-05-19 ENCOUNTER — Ambulatory Visit (INDEPENDENT_AMBULATORY_CARE_PROVIDER_SITE_OTHER): Payer: 59 | Admitting: Surgery

## 2016-05-19 VITALS — BP 121/84 | HR 87 | Temp 98.6°F | Ht 62.0 in | Wt 111.0 lb

## 2016-05-19 DIAGNOSIS — K8020 Calculus of gallbladder without cholecystitis without obstruction: Secondary | ICD-10-CM | POA: Diagnosis not present

## 2016-05-19 NOTE — Progress Notes (Signed)
Patient ID: Yvette Wise, female   DOB: 07/31/1981, 35 y.o.   MRN: [id number]  History of Present Illness Yvette Wise is a 35 y.o. female in consultation for biliary colic. Had symptoms for over a year and has had about 2 episodes of acute cholecystitis. And now more recently over the last couple months has been having frequent episodes of right upper quadrant pain and after a meal. He nausea. Pain is dull is moderate in severity and lasted for about an hour or so. No evidence of biliary obstruction no fevers no chills. Ultrasound showing gallstones no evidence of cholecystitis and no evidence of biliary obstruction. Normal common bile duct. She is otherwise in good health and is able to perform more than 4 Mets of activity without any shortness of breath or chest pain  Past Medical History Past Medical History  Diagnosis Date  . Depression        Past Surgical History  Procedure Laterality Date  . No past surgeries      Allergies  Allergen Reactions  . Celexa [Citalopram] Other (See Comments)    Severe nausea, vomiting, diarrhea, chills and fatigue    Current Outpatient Prescriptions  Medication Sig Dispense Refill  . norethindrone (MICRONOR,CAMILA,ERRIN) 0.35 MG tablet Take 1 tablet by mouth daily.    . ondansetron (ZOFRAN) 4 MG tablet Take 4 mg by mouth every 8 (eight) hours as needed for nausea or vomiting.     No current facility-administered medications for this visit.    Family History Family History  Problem Relation Age of Onset  . CVA Mother   . Depression Mother   . Anxiety disorder Mother   . Clotting disorder Mother      Social History Social History  Substance Use Topics  . Smoking status: Never Smoker   . Smokeless tobacco: Never Used  . Alcohol Use: No       ROS 10 pt ROS performed and was otherwise negative  Physical Exam Blood pressure 121/84, pulse 87, temperature 98.6 F (37 C), height 5' 2" (1.575 m), weight 50.349 kg (111 lb), last  menstrual period 05/06/2016.  CONSTITUTIONAL: NAD EYES: Pupils equal, round, and reactive to light, Sclera non-icteric. EARS, NOSE, MOUTH AND THROAT: The oropharynx is clear. Oral mucosa is pink and moist. Hearing is intact to voice.  NECK: Trachea is midline, and there is no jugular venous distension. Thyroid is without palpable abnormalities. LYMPH NODES:  Lymph nodes in the neck are not enlarged. RESPIRATORY:  Lungs are clear, and breath sounds are equal bilaterally. Normal respiratory effort without pathologic use of accessory muscles. CARDIOVASCULAR: Heart is regular without murmurs, gallops, or rubs. GI: The abdomen is  soft, nontender, and nondistended. There were no palpable masses. There was no hepatosplenomegaly. There were normal bowel sounds. GU: Deferred MUSCULOSKELETAL:  Normal muscle strength and tone in all four extremities.    SKIN: Skin turgor is normal. There are no pathologic skin lesions.  NEUROLOGIC:  Motor and sensation is grossly normal.  Cranial nerves are grossly intact. PSYCH:  Alert and oriented to person, place and time. Affect is normal.  Data Reviewed  I have personally reviewed the patient's imaging and medical records.    Assessment/Plan Symptomatic cholelithiasis discussed with the patient in detail and I recommend laparoscopic cholecystectomyThe risks, benefits, complications, treatment options, and expected outcomes were discussed with the patient. The possibilities of bleeding, recurrent infection, finding a normal gallbladder, perforation of viscus organs, damage to surrounding structures, bile leak, abscess formation, needing   a drain placed, the need for additional procedures, reaction to medication, pulmonary aspiration,  failure to diagnose a condition, the possible need to convert to an open procedure, and creating a complication requiring transfusion or operation were discussed with the patient. The patient and/or family concurred with the proposed  plan, giving informed consent.  All questions answered. Diego pabon, MD FACS  Diego F Pabon 05/19/2016, 12:36 PM

## 2016-05-20 ENCOUNTER — Telehealth: Payer: Self-pay

## 2016-05-20 ENCOUNTER — Telehealth: Payer: Self-pay | Admitting: Surgery

## 2016-05-20 NOTE — Telephone Encounter (Signed)
Patient requesting Roxycet to last until Gall stone surgery 06/03/2016. I advised her to call Surgeon.

## 2016-05-20 NOTE — Telephone Encounter (Signed)
Pt advised of pre op date/time and sx date. Sx: 06/03/16 with Dr Pabon--Laparoscopic Cholecystectomy.  Pre op: 05/26/16 between 9-1:00pm--Phone.   Patient made aware to call [phone], between 1-3:00pm the day before surgery, to find out what time to arrive.     Patient has agreed to make a deposit prior to surgery of 100.00. Patient's physician estimate is 891.49.

## 2016-05-26 ENCOUNTER — Inpatient Hospital Stay
Admission: RE | Admit: 2016-05-26 | Discharge: 2016-05-26 | Disposition: A | Discharge status: Home / Self Care | Payer: Self-pay | Source: Ambulatory Visit

## 2016-05-26 NOTE — Patient Instructions (Signed)
  Your procedure is scheduled ZO:XWRUEAon:Friday June 16 , 2017. Report to Same Day Surgery. To find out your arrival time please call [phone] between 1PM - 3PM on Thursday June 02, 2016.  Remember: Instructions that are not followed completely may result in serious medical risk, up to and including death, or upon the discretion of your surgeon and anesthesiologist your surgery may need to be rescheduled.    _x___ 1. Do not eat food or drink liquids after midnight. No gum chewing or  hard candies.     ____ 2. No Alcohol for 24 hours before or after surgery.   ____ 3. Bring all medications with you on the day of surgery if instructed.    __x__ 4. Notify your doctor if there is any change in your medical condition     (cold, fever, infections).     Do not wear jewelry, make-up, hairpins, clips or nail polish.  Do not wear lotions, powders, or perfumes. You may wear deodorant.  Do not shave 48 hours prior to surgery. Men may shave face and neck.  Do not bring valuables to the hospital.    [facility] is not responsible for any belongings or valuables.               Contacts, dentures or bridgework may not be worn into surgery.  Leave your suitcase in the car. After surgery it may be brought to your room.  For patients admitted to the hospital, discharge time is determined by your treatment team.   Patients discharged the day of surgery will not be allowed to drive home.    Please read over the following fact sheets that you were given:   [facility]  _x___ Take these medicines the morning of surgery with A SIP OF WATER:     1.norethindrone (MICRONOR,CAMILA,ERRIN) if surgery is later in the morning or day, if surgery is early morning, take when you get  home.    ____ Fleet Enema (as directed)   ____ Use CHG Soap as directed on instruction sheet  ____ Use inhalers on the day of surgery and bring to hospital day of surgery  ____ Stop metformin 2 days prior to  surgery    ____ Take 1/2 of usual insulin dose the night before surgery and none on the morning of  surgery.   ____ Stop Coumadin/Plavix/aspirin on does not apply.  _x___ Stop Anti-inflammatories such as Advil, Aleve, Ibuprofen, Motrin, Naproxen, Naprosyn, Goodies powders or aspirin products.Ok to take Tylenol.   ____ Stop supplements until after surgery.    ____ Bring C-Pap to the hospital.

## 2016-05-30 ENCOUNTER — Telehealth: Payer: Self-pay | Admitting: Surgery

## 2016-05-30 NOTE — Telephone Encounter (Signed)
Patient is having surgery Friday for gallstones. She would like to know if the dr can prescribe some pain medication before surgery. The pain in everyday now and it is a 7.  Oxycod-acetamin 5/325 mg per patient

## 2016-05-30 NOTE — Telephone Encounter (Signed)
Returned phone call to patient at this time. Patient states that she is having nausea after eating, denies vomiting. Denies fever. Denies severe abdominal pain. Epigastric pain radiating to shoulders after eating some meals. Pain does go away completely 1-3 hours after eating. Denies constipation or diarrhea.   Will speak with Dr. Tonita CongWoodham tomorrow in regards to patient's pain and return phone call to patient at that time. Did explain that I would call her back after speaking with surgeon tomorrow.

## 2016-06-01 DIAGNOSIS — Z79899 Other long term (current) drug therapy: Secondary | ICD-10-CM | POA: Diagnosis not present

## 2016-06-01 DIAGNOSIS — K8020 Calculus of gallbladder without cholecystitis without obstruction: Secondary | ICD-10-CM | POA: Diagnosis present

## 2016-06-01 DIAGNOSIS — F329 Major depressive disorder, single episode, unspecified: Secondary | ICD-10-CM | POA: Diagnosis not present

## 2016-06-01 DIAGNOSIS — K8010 Calculus of gallbladder with chronic cholecystitis without obstruction: Secondary | ICD-10-CM | POA: Diagnosis not present

## 2016-06-02 MED ORDER — SODIUM CHLORIDE 0.9 % IJ SOLN
INTRAMUSCULAR | Status: AC
  Filled 2016-06-02: qty 50

## 2016-06-03 ENCOUNTER — Encounter
Admission: RE | Disposition: A | Discharge status: Home / Self Care | Payer: Self-pay | Source: Ambulatory Visit | Attending: Surgery

## 2016-06-03 ENCOUNTER — Ambulatory Visit: Payer: Commercial Managed Care - HMO | Admitting: Anesthesiology

## 2016-06-03 ENCOUNTER — Ambulatory Visit
Admission: RE | Admit: 2016-06-03 | Discharge: 2016-06-03 | Disposition: A | Discharge status: Home / Self Care | Payer: Commercial Managed Care - HMO | Source: Ambulatory Visit | Attending: Surgery | Admitting: Surgery

## 2016-06-03 DIAGNOSIS — K8010 Calculus of gallbladder with chronic cholecystitis without obstruction: Secondary | ICD-10-CM | POA: Insufficient documentation

## 2016-06-03 DIAGNOSIS — F329 Major depressive disorder, single episode, unspecified: Secondary | ICD-10-CM | POA: Insufficient documentation

## 2016-06-03 DIAGNOSIS — K8020 Calculus of gallbladder without cholecystitis without obstruction: Secondary | ICD-10-CM | POA: Insufficient documentation

## 2016-06-03 DIAGNOSIS — Z79899 Other long term (current) drug therapy: Secondary | ICD-10-CM | POA: Insufficient documentation

## 2016-06-03 HISTORY — PX: CHOLECYSTECTOMY: SHX55

## 2016-06-03 LAB — POCT PREGNANCY, URINE: PREG TEST UR: NEGATIVE

## 2016-06-03 SURGERY — LAPAROSCOPIC CHOLECYSTECTOMY
Anesthesia: General | Site: Abdomen | Wound class: Clean Contaminated

## 2016-06-03 MED ORDER — LACTATED RINGERS IV SOLN
INTRAVENOUS | Status: DC
  Administered 2016-06-03: 09:00:00 via INTRAVENOUS
  Administered 2016-06-03: 1000 mL via INTRAVENOUS
  Administered 2016-06-03: 08:00:00 via INTRAVENOUS

## 2016-06-03 MED ORDER — MIDAZOLAM HCL 2 MG/2ML IJ SOLN
INTRAMUSCULAR | Status: DC | PRN
  Administered 2016-06-03: 2 mg via INTRAVENOUS

## 2016-06-03 MED ORDER — FENTANYL CITRATE (PF) 100 MCG/2ML IJ SOLN
INTRAMUSCULAR | Status: DC | PRN
  Administered 2016-06-03: 50 ug via INTRAVENOUS

## 2016-06-03 MED ORDER — CEFAZOLIN SODIUM-DEXTROSE 2-4 GM/100ML-% IV SOLN
2.0000 g | INTRAVENOUS | Status: AC
  Administered 2016-06-03: 2 g via INTRAVENOUS

## 2016-06-03 MED ORDER — FAMOTIDINE 20 MG PO TABS
ORAL_TABLET | ORAL | Status: AC
  Administered 2016-06-03: 20 mg via ORAL
  Filled 2016-06-03: qty 1

## 2016-06-03 MED ORDER — EPINEPHRINE HCL 1 MG/ML IJ SOLN
INTRAMUSCULAR | Status: AC
  Filled 2016-06-03: qty 1

## 2016-06-03 MED ORDER — SUGAMMADEX SODIUM 200 MG/2ML IV SOLN
INTRAVENOUS | Status: DC | PRN
  Administered 2016-06-03: 99.8 mg via INTRAVENOUS

## 2016-06-03 MED ORDER — ACETAMINOPHEN 10 MG/ML IV SOLN
INTRAVENOUS | Status: AC
  Filled 2016-06-03: qty 100

## 2016-06-03 MED ORDER — ONDANSETRON HCL 4 MG/2ML IJ SOLN
INTRAMUSCULAR | Status: DC | PRN
  Administered 2016-06-03: 4 mg via INTRAVENOUS

## 2016-06-03 MED ORDER — FAMOTIDINE 20 MG PO TABS
20.0000 mg | ORAL_TABLET | Freq: Once | ORAL | Status: AC
Start: 2016-06-03 — End: 2016-06-03
  Administered 2016-06-03: 20 mg via ORAL

## 2016-06-03 MED ORDER — BUPIVACAINE HCL (PF) 0.25 % IJ SOLN
INTRAMUSCULAR | Status: AC
  Filled 2016-06-03: qty 30

## 2016-06-03 MED ORDER — BUPIVACAINE-EPINEPHRINE (PF) 0.5% -1:200000 IJ SOLN
INTRAMUSCULAR | Status: AC
  Filled 2016-06-03: qty 30

## 2016-06-03 MED ORDER — ACETAMINOPHEN 10 MG/ML IV SOLN
INTRAVENOUS | Status: DC | PRN
  Administered 2016-06-03: 1000 mg via INTRAVENOUS

## 2016-06-03 MED ORDER — HYDROCODONE-ACETAMINOPHEN 5-325 MG PO TABS
1.0000 | ORAL_TABLET | ORAL | Status: DC | PRN
  Administered 2016-06-03: 1 via ORAL

## 2016-06-03 MED ORDER — HYDROCODONE-ACETAMINOPHEN 5-325 MG PO TABS: 1.0000 | ORAL_TABLET | ORAL | Status: DC | PRN

## 2016-06-03 MED ORDER — KETOROLAC TROMETHAMINE 30 MG/ML IJ SOLN
INTRAMUSCULAR | Status: DC | PRN
  Administered 2016-06-03: 30 mg via INTRAVENOUS

## 2016-06-03 MED ORDER — HYDROMORPHONE HCL 1 MG/ML IJ SOLN
INTRAMUSCULAR | Status: AC
  Filled 2016-06-03: qty 1

## 2016-06-03 MED ORDER — CEFAZOLIN SODIUM-DEXTROSE 2-4 GM/100ML-% IV SOLN
INTRAVENOUS | Status: AC
  Administered 2016-06-03: 2 g via INTRAVENOUS
  Filled 2016-06-03: qty 100

## 2016-06-03 MED ORDER — CHLORHEXIDINE GLUCONATE 4 % EX LIQD: 1.0000 "application " | Freq: Once | CUTANEOUS | Status: DC

## 2016-06-03 MED ORDER — HYDROMORPHONE HCL 1 MG/ML IJ SOLN
0.2500 mg | INTRAMUSCULAR | Status: DC | PRN
  Administered 2016-06-03 (×2): 0.5 mg via INTRAVENOUS

## 2016-06-03 MED ORDER — BUPIVACAINE-EPINEPHRINE (PF) 0.5% -1:200000 IJ SOLN
INTRAMUSCULAR | Status: DC | PRN
  Administered 2016-06-03: 30 mL

## 2016-06-03 MED ORDER — HYDROCODONE-ACETAMINOPHEN 5-325 MG PO TABS
ORAL_TABLET | ORAL | Status: AC
  Filled 2016-06-03: qty 1

## 2016-06-03 MED ORDER — KETAMINE HCL 50 MG/ML IJ SOLN
INTRAMUSCULAR | Status: DC | PRN
  Administered 2016-06-03: 20 mg via INTRAMUSCULAR

## 2016-06-03 MED ORDER — LIDOCAINE HCL (CARDIAC) 20 MG/ML IV SOLN
INTRAVENOUS | Status: DC | PRN
  Administered 2016-06-03: 60 mg via INTRAVENOUS

## 2016-06-03 MED ORDER — DEXAMETHASONE SODIUM PHOSPHATE 10 MG/ML IJ SOLN
INTRAMUSCULAR | Status: DC | PRN
  Administered 2016-06-03: 10 mg via INTRAVENOUS

## 2016-06-03 MED ORDER — ONDANSETRON HCL 4 MG/2ML IJ SOLN
INTRAMUSCULAR | Status: AC
  Filled 2016-06-03: qty 2

## 2016-06-03 MED ORDER — PHENYLEPHRINE HCL 10 MG/ML IJ SOLN
INTRAMUSCULAR | Status: DC | PRN
  Administered 2016-06-03: 50 ug via INTRAVENOUS
  Administered 2016-06-03: 100 ug via INTRAVENOUS

## 2016-06-03 MED ORDER — PROPOFOL 10 MG/ML IV BOLUS
INTRAVENOUS | Status: DC | PRN
  Administered 2016-06-03: 100 mg via INTRAVENOUS

## 2016-06-03 MED ORDER — ONDANSETRON HCL 4 MG/2ML IJ SOLN
4.0000 mg | Freq: Once | INTRAMUSCULAR | Status: AC | PRN
  Administered 2016-06-03: 4 mg via INTRAVENOUS

## 2016-06-03 SURGICAL SUPPLY — 43 items
APPLICATOR COTTON TIP 6IN STRL (MISCELLANEOUS) ×3 IMPLANT
APPLIER CLIP 5 13 M/L LIGAMAX5 (MISCELLANEOUS) ×3
BLADE SURG 15 STRL LF DISP TIS (BLADE) ×1 IMPLANT
BLADE SURG 15 STRL SS (BLADE) ×2
CANISTER SUCT 1200ML W/VALVE (MISCELLANEOUS) ×3 IMPLANT
CHLORAPREP W/TINT 26ML (MISCELLANEOUS) ×3 IMPLANT
CHOLANGIOGRAM CATH TAUT (CATHETERS) IMPLANT
CLEANER CAUTERY TIP 5X5 PAD (MISCELLANEOUS) ×1 IMPLANT
CLIP APPLIE 5 13 M/L LIGAMAX5 (MISCELLANEOUS) ×1 IMPLANT
DECANTER SPIKE VIAL GLASS SM (MISCELLANEOUS) ×6 IMPLANT
DEVICE TROCAR PUNCTURE CLOSURE (ENDOMECHANICALS) IMPLANT
DRAPE C-ARM XRAY 36X54 (DRAPES) ×3 IMPLANT
ELECT REM PT RETURN 9FT ADLT (ELECTROSURGICAL) ×3
ELECTRODE REM PT RTRN 9FT ADLT (ELECTROSURGICAL) ×1 IMPLANT
ENDOPOUCH RETRIEVER 10 (MISCELLANEOUS) ×3 IMPLANT
GLOVE BIO SURGEON STRL SZ7 (GLOVE) ×3 IMPLANT
GOWN STRL REUS W/ TWL LRG LVL3 (GOWN DISPOSABLE) ×3 IMPLANT
GOWN STRL REUS W/TWL LRG LVL3 (GOWN DISPOSABLE) ×6
IRRIGATION STRYKERFLOW (MISCELLANEOUS) ×1 IMPLANT
IRRIGATOR STRYKERFLOW (MISCELLANEOUS) ×3
IV CATH ANGIO 12GX3 LT BLUE (NEEDLE) ×3 IMPLANT
IV SOD CHL 0.9% 1000ML (IV SOLUTION) ×3 IMPLANT
L-HOOK LAP DISP 36CM (ELECTROSURGICAL) ×3
LHOOK LAP DISP 36CM (ELECTROSURGICAL) ×1 IMPLANT
LIQUID BAND (GAUZE/BANDAGES/DRESSINGS) ×3 IMPLANT
NEEDLE HYPO 22GX1.5 SAFETY (NEEDLE) ×3 IMPLANT
PACK LAP CHOLECYSTECTOMY (MISCELLANEOUS) ×3 IMPLANT
PAD CLEANER CAUTERY TIP 5X5 (MISCELLANEOUS) ×2
PENCIL ELECTRO HAND CTR (MISCELLANEOUS) ×3 IMPLANT
SCISSORS METZENBAUM CVD 33 (INSTRUMENTS) ×3 IMPLANT
SLEEVE ENDOPATH XCEL 5M (ENDOMECHANICALS) ×6 IMPLANT
SOL ANTI-FOG 6CC FOG-OUT (MISCELLANEOUS) ×1 IMPLANT
SOL FOG-OUT ANTI-FOG 6CC (MISCELLANEOUS) ×2
STOPCOCK 3 WAY  REPLAC (MISCELLANEOUS) IMPLANT
SUT ETHIBOND 0 MO6 C/R (SUTURE) IMPLANT
SUT MNCRL AB 4-0 PS2 18 (SUTURE) ×3 IMPLANT
SUT VIC AB 0 CT2 27 (SUTURE) IMPLANT
SUT VICRYL 0 AB UR-6 (SUTURE) ×6 IMPLANT
SYR 20CC LL (SYRINGE) ×3 IMPLANT
TROCAR XCEL BLUNT TIP 100MML (ENDOMECHANICALS) ×3 IMPLANT
TROCAR XCEL NON-BLD 5MMX100MML (ENDOMECHANICALS) ×3 IMPLANT
TUBING INSUFFLATOR HI FLOW (MISCELLANEOUS) ×3 IMPLANT
WATER STERILE IRR 1000ML POUR (IV SOLUTION) ×3 IMPLANT

## 2016-06-03 NOTE — H&P (View-Only) (Signed)
Patient ID: Yvette Wise, female   DOB: 1981-05-05, 35 y.o.   MRN: [id number]  History of Present Illness Yvette Wise is a 35 y.o. female in consultation for biliary colic. Had symptoms for over a year and has had about 2 episodes of acute cholecystitis. And now more recently over the last couple months has been having frequent episodes of right upper quadrant pain and after a meal. He nausea. Pain is dull is moderate in severity and lasted for about an hour or so. No evidence of biliary obstruction no fevers no chills. Ultrasound showing gallstones no evidence of cholecystitis and no evidence of biliary obstruction. Normal common bile duct. She is otherwise in good health and is able to perform more than 4 Mets of activity without any shortness of breath or chest pain  Past Medical History Past Medical History  Diagnosis Date  . Depression        Past Surgical History  Procedure Laterality Date  . No past surgeries      Allergies  Allergen Reactions  . Celexa [Citalopram] Other (See Comments)    Severe nausea, vomiting, diarrhea, chills and fatigue    Current Outpatient Prescriptions  Medication Sig Dispense Refill  . norethindrone (MICRONOR,CAMILA,ERRIN) 0.35 MG tablet Take 1 tablet by mouth daily.    . ondansetron (ZOFRAN) 4 MG tablet Take 4 mg by mouth every 8 (eight) hours as needed for nausea or vomiting.     No current facility-administered medications for this visit.    Family History Family History  Problem Relation Age of Onset  . CVA Mother   . Depression Mother   . Anxiety disorder Mother   . Clotting disorder Mother      Social History Social History  Substance Use Topics  . Smoking status: Never Smoker   . Smokeless tobacco: Never Used  . Alcohol Use: No       ROS 10 pt ROS performed and was otherwise negative  Physical Exam Blood pressure 121/84, pulse 87, temperature 98.6 F (37 C), height  (1.575 m), weight 50.349 kg (111 lb), last  menstrual period 05/06/2016.  CONSTITUTIONAL: NAD EYES: Pupils equal, round, and reactive to light, Sclera non-icteric. EARS, NOSE, MOUTH AND THROAT: The oropharynx is clear. Oral mucosa is pink and moist. Hearing is intact to voice.  NECK: Trachea is midline, and there is no jugular venous distension. Thyroid is without palpable abnormalities. LYMPH NODES:  Lymph nodes in the neck are not enlarged. RESPIRATORY:  Lungs are clear, and breath sounds are equal bilaterally. Normal respiratory effort without pathologic use of accessory muscles. CARDIOVASCULAR: Heart is regular without murmurs, gallops, or rubs. GI: The abdomen is  soft, nontender, and nondistended. There were no palpable masses. There was no hepatosplenomegaly. There were normal bowel sounds. GU: Deferred MUSCULOSKELETAL:  Normal muscle strength and tone in all four extremities.    SKIN: Skin turgor is normal. There are no pathologic skin lesions.  NEUROLOGIC:  Motor and sensation is grossly normal.  Cranial nerves are grossly intact. PSYCH:  Alert and oriented to person, place and time. Affect is normal.  Data Reviewed  I have personally reviewed the patient's imaging and medical records.    Assessment/Plan Symptomatic cholelithiasis discussed with the patient in detail and I recommend laparoscopic cholecystectomyThe risks, benefits, complications, treatment options, and expected outcomes were discussed with the patient. The possibilities of bleeding, recurrent infection, finding a normal gallbladder, perforation of viscus organs, damage to surrounding structures, bile leak, abscess formation, needing  a drain placed, the need for additional procedures, reaction to medication, pulmonary aspiration,  failure to diagnose a condition, the possible need to convert to an open procedure, and creating a complication requiring transfusion or operation were discussed with the patient. The patient and/or family concurred with the proposed  plan, giving informed consent.  All questions answered. Yvette pabon, MD FACS  Yvette F Wise 05/19/2016, 12:36 PM

## 2016-06-03 NOTE — Discharge Instructions (Signed)

## 2016-06-03 NOTE — Anesthesia Preprocedure Evaluation (Signed)
Anesthesia Evaluation  Patient identified by MRN, date of birth, ID band Patient awake    Reviewed: Allergy & Precautions, NPO status , Patient's Chart, lab work & pertinent test results  Airway Mallampati: I  TM Distance: >3 FB Neck ROM: Full    Dental  (+) Teeth Intact   Pulmonary neg pulmonary ROS,    Pulmonary exam normal        Cardiovascular Exercise Tolerance: Good negative cardio ROS Normal cardiovascular exam     Neuro/Psych negative neurological ROS     GI/Hepatic negative GI ROS, Gallstones and bouts of cholecystitis.   Endo/Other  negative endocrine ROS  Renal/GU negative Renal ROS  negative genitourinary   Musculoskeletal   Abdominal Normal abdominal exam  (+)   Peds negative pediatric ROS (+)  Hematology negative hematology ROS (+)   Anesthesia Other Findings   Reproductive/Obstetrics                             Anesthesia Physical Anesthesia Plan  ASA: I  Anesthesia Plan: General   Post-op Pain Management:    Induction: Intravenous  Airway Management Planned: Oral ETT  Additional Equipment:   Intra-op Plan:   Post-operative Plan: Extubation in OR  Informed Consent: I have reviewed the patients History and Physical, chart, labs and discussed the procedure including the risks, benefits and alternatives for the proposed anesthesia with the patient or authorized representative who has indicated his/her understanding and acceptance.     Plan Discussed with: CRNA  Anesthesia Plan Comments:         Anesthesia Quick Evaluation

## 2016-06-03 NOTE — Interval H&P Note (Signed)
History and Physical Interval Note:  06/03/2016 9:17 AM  Yvette Wise  has presented today for surgery, with the diagnosis of calculus of gallbladder without cholecystitis  The various methods of treatment have been discussed with the patient and family. After consideration of risks, benefits and other options for treatment, the patient has consented to  Procedure(s): LAPAROSCOPIC CHOLECYSTECTOMY (N/A) as a surgical intervention .  The patient's history has been reviewed, patient examined, no change in status, stable for surgery.  I have reviewed the patient's chart and labs.  Questions were answered to the patient's satisfaction.     Diego F Pabon

## 2016-06-03 NOTE — Anesthesia Postprocedure Evaluation (Signed)
Anesthesia Post Note  Patient: Yvette CannonJennifer L Kullman  Procedure(s) Performed: Procedure(s) (LRB): LAPAROSCOPIC CHOLECYSTECTOMY (N/A)  Patient location during evaluation: PACU Anesthesia Type: General Level of consciousness: awake Pain management: pain level controlled Vital Signs Assessment: post-procedure vital signs reviewed and stable Respiratory status: spontaneous breathing Cardiovascular status: blood pressure returned to baseline Postop Assessment: no headache Anesthetic complications: no    Last Vitals:  Filed Vitals:   06/03/16 0758 06/03/16 1043  BP: 134/86 108/53  Pulse: 91 124  Temp: 36.9 C 36.3 C  Resp: 16 17    Last Pain:  Filed Vitals:   06/03/16 1046  PainSc: 0-No pain                 Obie Kallenbach M

## 2016-06-03 NOTE — Anesthesia Procedure Notes (Signed)
Procedure Name: Intubation Date/Time: 06/03/2016 9:47 AM Performed by: Evelena PeatFERRERO-CONOVER, Rhylin Venters Pre-anesthesia Checklist: Patient identified Patient Re-evaluated:Patient Re-evaluated prior to inductionOxygen Delivery Method: Circle system utilized Preoxygenation: Pre-oxygenation with 100% oxygen Intubation Type: IV induction Ventilation: Mask ventilation without difficulty Laryngoscope Size: Miller and 2 Grade View: Grade I Tube type: Oral Tube size: 6.5 mm Number of attempts: 1 Airway Equipment and Method: Stylet

## 2016-06-03 NOTE — Transfer of Care (Signed)
Immediate Anesthesia Transfer of Care Note  Patient: Yvette CannonJennifer L Wise  Procedure(s) Performed: Procedure(s): LAPAROSCOPIC CHOLECYSTECTOMY (N/A)  Patient Location: PACU  Anesthesia Type:General  Level of Consciousness: awake, alert  and oriented  Airway & Oxygen Therapy: Patient Spontanous Breathing and Patient connected to nasal cannula oxygen  Post-op Assessment: Report given to RN and Post -op Vital signs reviewed and stable  Post vital signs: Reviewed and stable  Last Vitals:  Filed Vitals:   06/03/16 0758  BP: 134/86  Pulse: 91  Temp: 36.9 C  Resp: 16    Last Pain: There were no vitals filed for this visit.       Complications: No apparent anesthesia complications

## 2016-06-03 NOTE — Op Note (Signed)
Laparoscopic Cholecystectomy  Pre-operative Diagnosis: Symptomatic cholelithiasis Post-operative Diagnosis: same   Procedure: Laparoscopic cholecystectomy  Surgeon: Sterling Bigiego Sotiria Keast, MD FACS  Anesthesia: Gen. with endotracheal tube  Findings: Chronic  Cholecystitis w multiple GS   Estimated Blood Loss: 5cc         Drains: none         Specimens: Gallbladder           Complications: none   Procedure Details  The patient was seen again in the Holding Room. The benefits, complications, treatment options, and expected outcomes were discussed with the patient. The risks of bleeding, infection, recurrence of symptoms, failure to resolve symptoms, bile duct damage, bile duct leak, retained common bile duct stone, bowel injury, any of which could require further surgery and/or ERCP, stent, or papillotomy were reviewed with the patient. The likelihood of improving the patient's symptoms with return to their baseline status is good.  The patient and/or family concurred with the proposed plan, giving informed consent.  The patient was taken to Operating Room, identified as Yvette Wise and the procedure verified as Laparoscopic Cholecystectomy.  A Time Out was held and the above information confirmed.  Prior to the induction of general anesthesia, antibiotic prophylaxis was administered. VTE prophylaxis was in place. General endotracheal anesthesia was then administered and tolerated well. After the induction, the abdomen was prepped with Chloraprep and draped in the sterile fashion. The patient was positioned in the supine position.  Local anesthetic  was injected into the skin near the umbilicus and an incision made. Cut down technique was used to enter the abdominal cavity and a Hasson trochar was placed after two vicryl stitches were anchored to the fascia. Pneumoperitoneum was then created with CO2 and tolerated well without any adverse changes in the patient's vital signs.  Three 5-mm ports  were placed in the right upper quadrant all under direct vision. All skin incisions  were infiltrated with a local anesthetic agent before making the incision and placing the trocars.   The patient was positioned  in reverse Trendelenburg, tilted slightly to the patient's left.  The gallbladder was identified, the fundus grasped and retracted cephalad. Adhesions were lysed bluntly. The infundibulum was grasped and retracted laterally, exposing the peritoneum overlying the triangle of Calot. This was then divided and exposed in a blunt fashion. An extended critical view of the cystic duct and cystic artery was obtained.  The cystic duct was clearly identified and bluntly dissected.   Artery and duct were double clipped and divided.  The gallbladder was taken from the gallbladder fossa in a retrograde fashion with the electrocautery. The gallbladder was removed and placed in an Endocatch bag. The liver bed was irrigated and inspected. Hemostasis was achieved with the electrocautery. Copious irrigation was utilized and was repeatedly aspirated until clear.  The gallbladder and Endocatch sac were then removed through the epigastric port site.    Inspection of the right upper quadrant was performed. No bleeding, bile duct injury or leak, or bowel injury was noted. Pneumoperitoneum was released.  The periumbilical port site was closed with figure-of-eight 0 Vicryl sutures. 4-0 subcuticular Monocryl was used to close the skin. Dermabond was  applied.  The patient was then extubated and brought to the recovery room in stable condition. Sponge, lap, and needle counts were correct at closure and at the conclusion of the case.               [name], MD, FACS

## 2016-06-03 NOTE — Transfer of Care (Incomplete)
Immediate Anesthesia Transfer of Care Note  Patient: Yvette CannonJennifer L Bigler  Procedure(s) Performed: Procedure(s): LAPAROSCOPIC CHOLECYSTECTOMY (N/A)  Patient Location: {PLACES; ANE POST:19477::"PACU"}  Anesthesia Type:{PROCEDURES; ANE POST ANESTHESIA TYPE:19480}  Level of Consciousness: {FINDINGS; ANE POST LEVEL OF CONSCIOUSNESS:19484}  Airway & Oxygen Therapy: {Exam; oxygen device:30095}  Post-op Assessment: {ASSESSMENT;POST-OP ZOXWRU:04540}REPORT:18741}  Post vital signs: {DESC; ANE POST JWJXBJ:47829}VITALS:19483}  Last Vitals:  Filed Vitals:   06/03/16 0758  BP: 134/86  Pulse: 91  Temp: 36.9 C  Resp: 16    Last Pain: There were no vitals filed for this visit.       Complications: {FINDINGS; ANE POST COMPLICATIONS:19485}

## 2016-06-06 ENCOUNTER — Encounter: Payer: Self-pay | Admitting: Surgery

## 2016-06-06 LAB — SURGICAL PATHOLOGY

## 2016-06-08 ENCOUNTER — Other Ambulatory Visit: Payer: Self-pay

## 2016-06-09 ENCOUNTER — Telehealth: Payer: Self-pay | Admitting: Surgery

## 2016-06-09 NOTE — Telephone Encounter (Signed)
Surgery last Friday. Could she go back to work Monday? Appointment 06/16/16

## 2016-06-09 NOTE — Telephone Encounter (Signed)
Spoke with patient at this time. She feels great. No concerns at this time and would like to return to work on 06/13/16. I did explain to patient that she would need to be at her post-op on 06/16/16 to be evaluated but the she may return with a lifting restriction on 06/13/16. [place] [facility] [place] [facility] [place].   Patient will pick-up after lunch.

## 2016-06-16 ENCOUNTER — Ambulatory Visit: Payer: 59 | Admitting: Surgery

## 2016-06-18 IMAGING — US US ABDOMEN LIMITED
1 series · 14 of 25 positions shown · non-contrast
Comparison: None.

CLINICAL DATA: Right upper quadrant and epigastric pain.

EXAM:
US ABDOMEN LIMITED - RIGHT UPPER QUADRANT

[Series 1: us abdomen limited · 0.20mm/px · 14 of 39 slices shown]
[im 1/39]
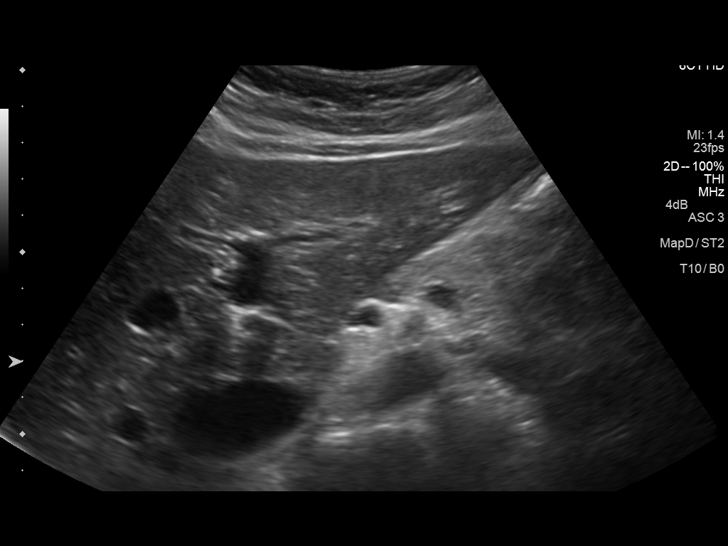
[im 4/39]
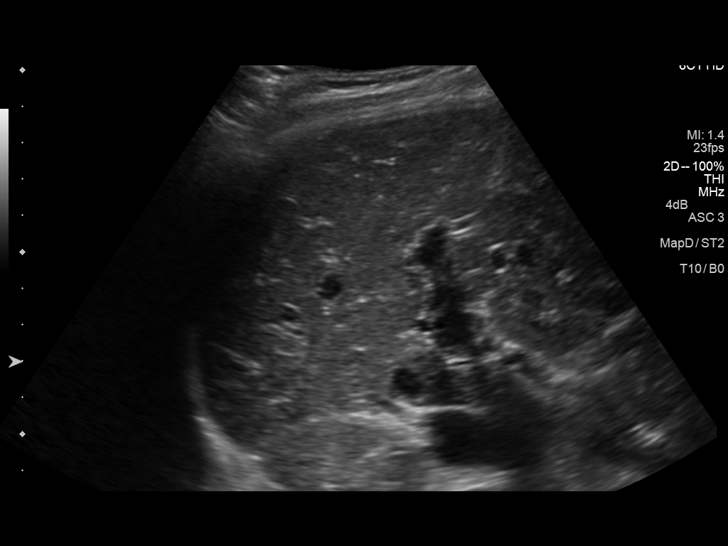
[im 7/39]
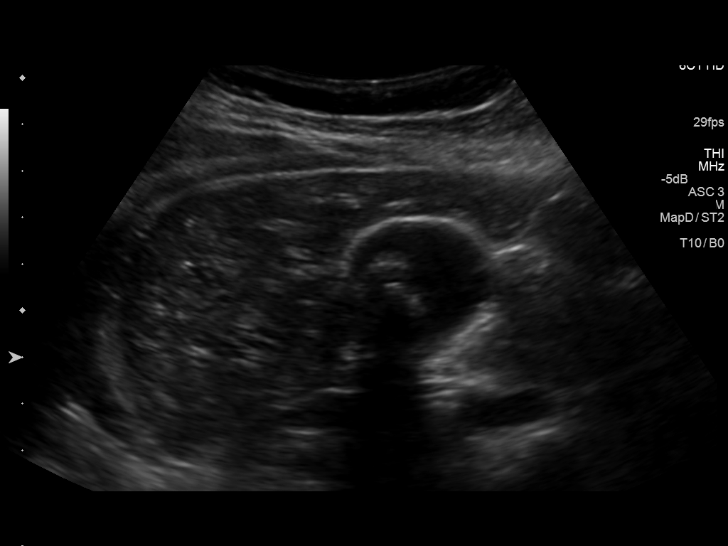
[im 10/39]
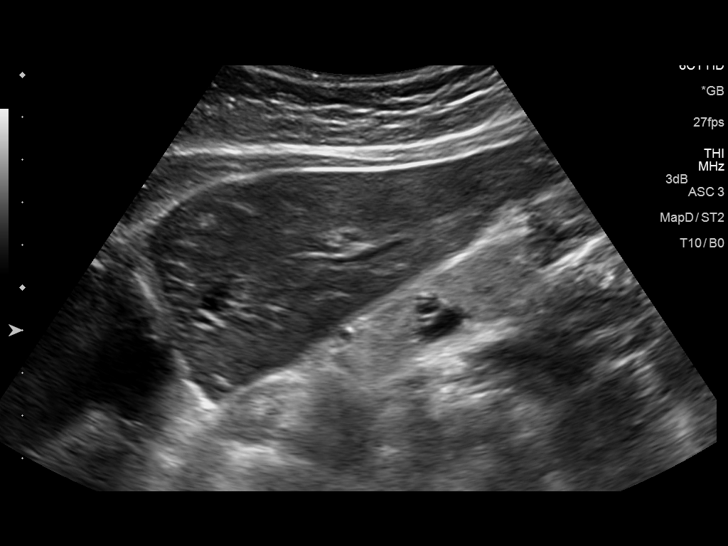
[im 13/39]
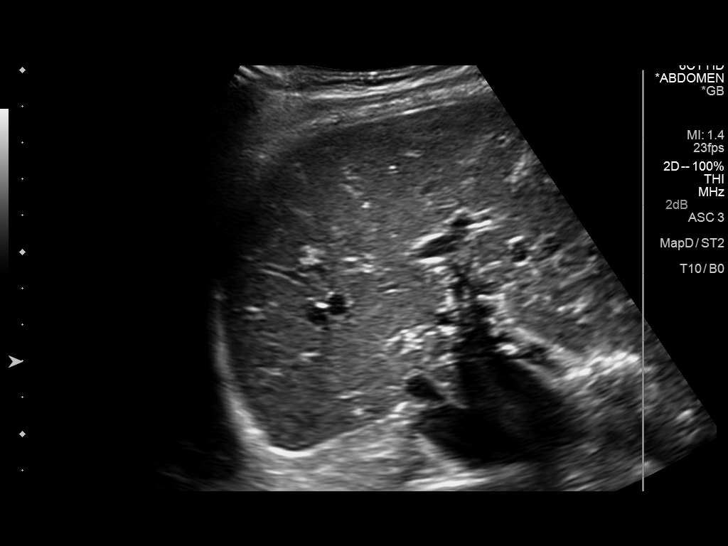
[im 15/39]
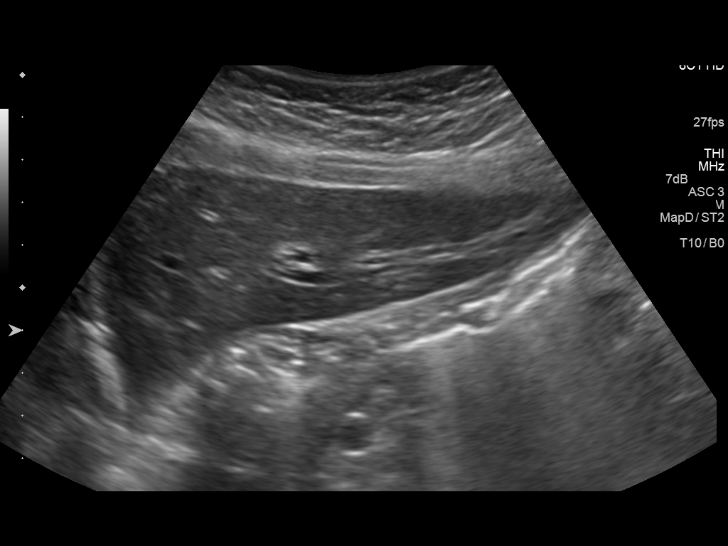
[im 18/39]
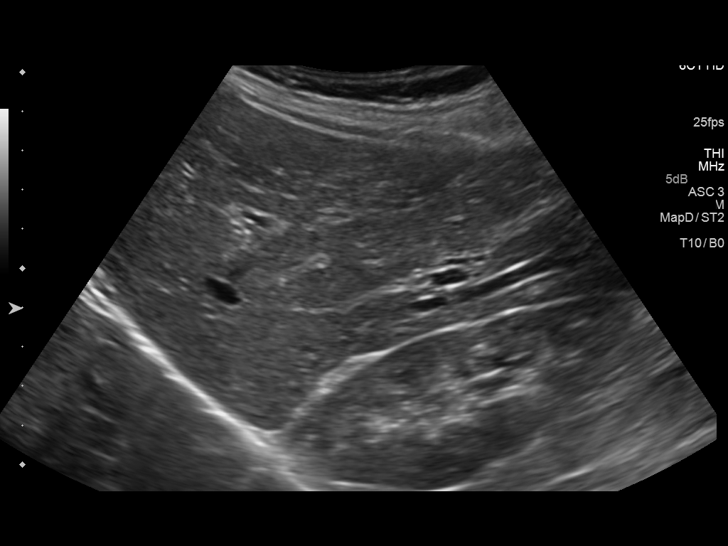
[im 21/39]
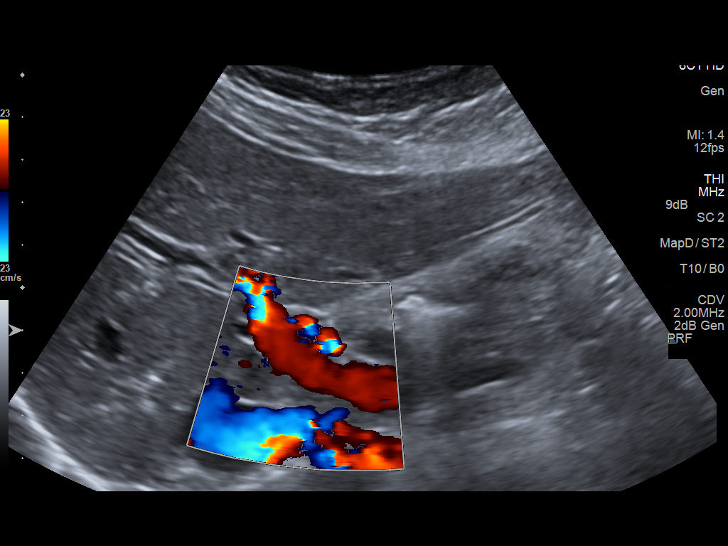
[im 24/39]
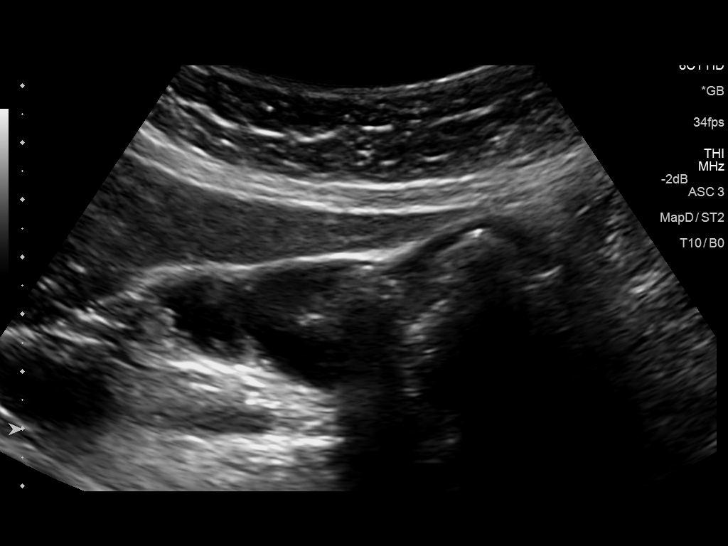
[im 26/39]
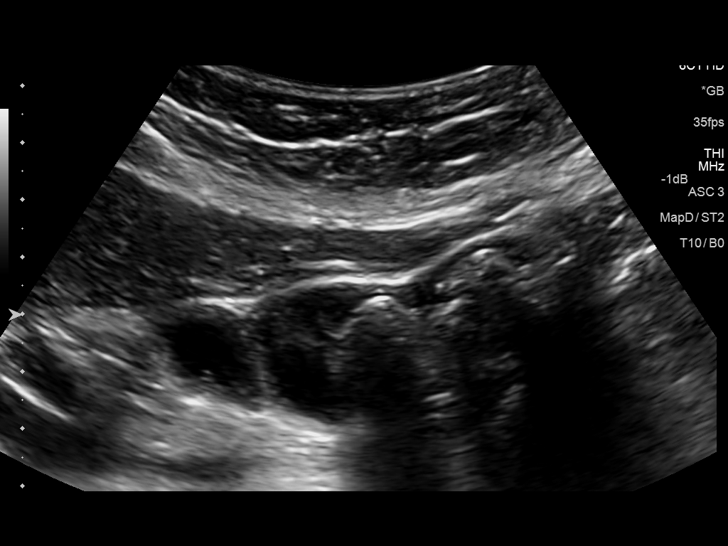
[im 29/39]
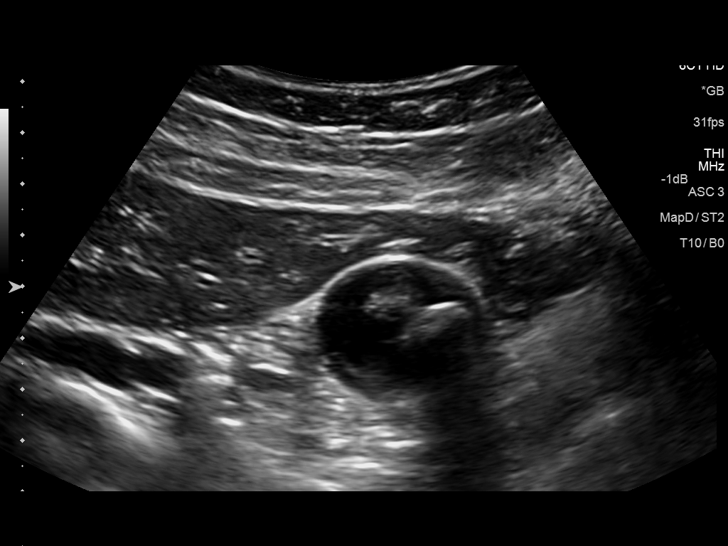
[im 32/39]
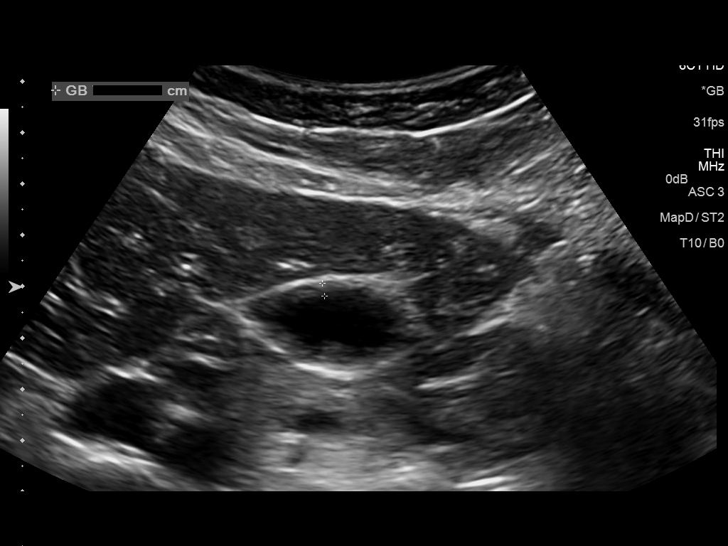
[im 35/39]
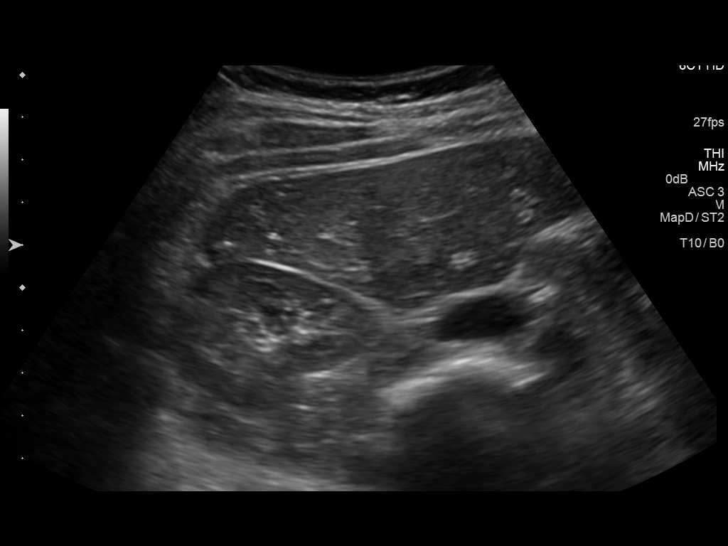
[im 39/39]
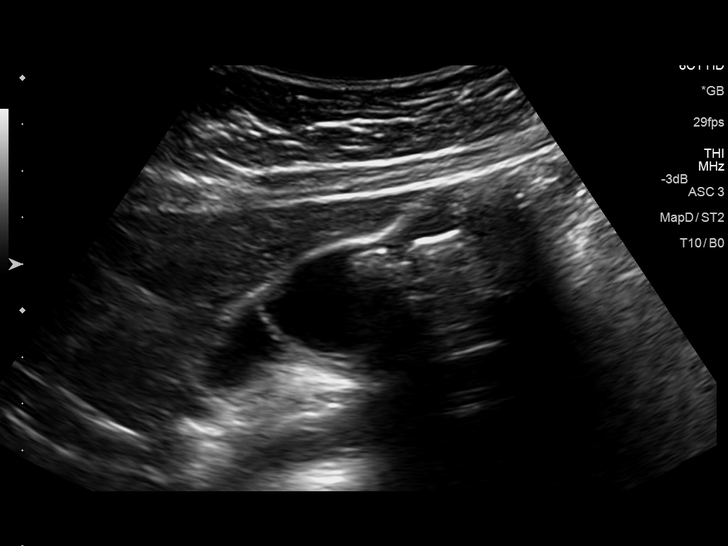

[14 of 25 positions shown; findings below may reference images not displayed]

FINDINGS: Gallbladder:

Multiple stones in the gallbladder, measuring up to about 1.5 cm
diameter. No gallbladder wall thickening. Murphy's sign is negative.

Common bile duct:

Diameter: 5.4 mm, normal

Liver:

No focal lesion identified. Within normal limits in parenchymal
echogenicity.
IMPRESSION: Cholelithiasis.  No additional inflammatory changes.

## 2016-06-22 ENCOUNTER — Ambulatory Visit: Payer: 59 | Admitting: Surgery

## 2016-06-28 ENCOUNTER — Encounter: Payer: Self-pay | Admitting: Surgery

## 2016-06-28 ENCOUNTER — Ambulatory Visit (INDEPENDENT_AMBULATORY_CARE_PROVIDER_SITE_OTHER): Payer: 59 | Admitting: Surgery

## 2016-06-28 VITALS — BP 125/69 | HR 81 | Temp 99.5°F | Ht 62.0 in | Wt 117.0 lb

## 2016-06-28 DIAGNOSIS — K8010 Calculus of gallbladder with chronic cholecystitis without obstruction: Secondary | ICD-10-CM | POA: Insufficient documentation

## 2016-06-28 NOTE — Progress Notes (Signed)
35 yr old female status post laparoscopic cholecystectomy on June 16 with Dr. Everlene FarrierPabon. Patient has been doing well she's been eating and drinking. Patient states occasionally she gets some nausea with fatty foods but she hasn't had any diarrhea. Patient states that she's feeling very good and her appetite and energy have returned. She is no longer taking pain medicine is having normal BMs. She is asking about when her lifting restrictions can be removed  Filed Vitals:   06/28/16 1303  BP: 125/69  Pulse: 81  Temp: 99.5 F (37.5 C)   PE:  Gen: NAD Abd: soft, non-tender, incisions c/d/i, small stitch spiting from R lateral port site and umbilical site cut today  A/P:  Patient healing well stitches cut from spitting. Discussed her pathology of chronic cholecystitis but no malignancy. Instructed the patient that her appetite and energy should improve over time and to call us with any questions or concerns.

## 2016-06-28 NOTE — Patient Instructions (Signed)
Please call our office if you have any questions or concerns.  

## 2017-06-06 ENCOUNTER — Ambulatory Visit: Payer: 59 | Admitting: Internal Medicine

## 2017-06-12 ENCOUNTER — Ambulatory Visit (INDEPENDENT_AMBULATORY_CARE_PROVIDER_SITE_OTHER): Payer: Self-pay | Admitting: Internal Medicine

## 2017-06-12 ENCOUNTER — Encounter: Payer: Self-pay | Admitting: Internal Medicine

## 2017-06-12 VITALS — BP 134/90 | HR 96 | Ht 62.0 in | Wt 125.8 lb

## 2017-06-12 DIAGNOSIS — F331 Major depressive disorder, recurrent, moderate: Secondary | ICD-10-CM

## 2017-06-12 DIAGNOSIS — F411 Generalized anxiety disorder: Secondary | ICD-10-CM

## 2017-06-12 MED ORDER — FLUOXETINE HCL 20 MG PO CAPS: 20.0000 mg | ORAL_CAPSULE | Freq: Every day | ORAL | 1 refills | Status: DC

## 2017-06-12 NOTE — Patient Instructions (Addendum)
Take 1/2 dose for 4 days then a whole capsule daily  Call to report progress in several weeks.

## 2017-06-12 NOTE — Progress Notes (Signed)
Date:  06/12/2017   Name:  Yvette Wise   DOB:  [birth year]   MRN:  [id number]   Chief Complaint: Anxiety (Was previously on Lexapro. Did not take long enough to give it a chance. X1 month has been going down hill. Having severe anxiety attacks. Mostly in the morning when waking up. "Feels like life is falling apart." Scaled a 16 on PHQ9. )    Review of Systems  Constitutional: Positive for appetite change, fatigue and unexpected weight change. Negative for fever.  Respiratory: Negative for chest tightness and shortness of breath.   Cardiovascular: Positive for palpitations. Negative for chest pain.  Gastrointestinal: Negative for abdominal pain, nausea and vomiting.  Skin: Negative for rash.  Neurological: Negative for dizziness and headaches.  Psychiatric/Behavioral: Positive for decreased concentration and dysphoric mood. Negative for sleep disturbance and suicidal ideas. The patient is nervous/anxious.     Patient Active Problem List   Diagnosis Date Noted  . Generalized anxiety disorder 05/03/2016    Prior to Admission medications   Not on File    Allergies  Allergen Reactions  . Celexa [Citalopram] Other (See Comments)    Severe nausea, vomiting, diarrhea, chills and fatigue    Past Surgical History:  Procedure Laterality Date  . CHOLECYSTECTOMY N/A 06/03/2016   Procedure: LAPAROSCOPIC CHOLECYSTECTOMY;  Surgeon: Leafy Ro, MD;  Location: [facility] ORS;  Service: General;  Laterality: N/A;  . NO PAST SURGERIES      Social History  Substance Use Topics  . Smoking status: Never Smoker  . Smokeless tobacco: Never Used  . Alcohol use No   Depression screen [facility] 2/9 06/12/2017 05/03/2016  Decreased Interest 3 0  Down, Depressed, Hopeless 3 2  PHQ - 2 Score 6 2  Altered sleeping 0 0  Tired, decreased energy 1 3  Change in appetite 3 0  Feeling bad or failure about yourself  3 0  Trouble concentrating 3 0  Moving slowly or fidgety/restless 0 0  Suicidal  thoughts 0 0  PHQ-9 Score 16 5  Difficult doing work/chores Extremely dIfficult -     Medication list has been reviewed and updated.   Physical Exam  Constitutional: She is oriented to person, place, and time. She appears well-developed. No distress.  HENT:  Head: Normocephalic and atraumatic.  Neck: Normal range of motion. No thyromegaly present.  Cardiovascular: Normal rate, regular rhythm and normal heart sounds.   Pulmonary/Chest: Effort normal and breath sounds normal. No respiratory distress.  Musculoskeletal: Normal range of motion.  Neurological: She is alert and oriented to person, place, and time.  Skin: Skin is warm and dry. No rash noted.  Psychiatric: Her speech is normal and behavior is normal. Judgment and thought content normal. Her mood appears anxious. Cognition and memory are normal. She exhibits a depressed mood.  Nursing note and vitals reviewed.   BP 134/90   Pulse 96   Ht 5\' 2"  (1.575 m)   Wt 125 lb 12.8 oz (57.1 kg)   LMP 06/12/2017   SpO2 98%   BMI 23.01 kg/m   Assessment and Plan: 1. Moderate episode of recurrent major depressive disorder (HCC) Begin Prozac 20 mg qd Call to report response in 3-4 weeks and to request refills Follow up if side effects or no benefit  2. Generalized anxiety disorder - FLUoxetine (PROZAC) 20 MG capsule; Take 1 capsule (20 mg total) by mouth daily.  Dispense: 30 capsule; Refill: 1   Meds ordered this encounter  Medications  .  FLUoxetine (PROZAC) 20 MG capsule    Sig: Take 1 capsule (20 mg total) by mouth daily.    Dispense:  30 capsule    Refill:  1    Bari EdwardLaura Oswald Pott, MD [facility]  06/12/2017

## 2017-07-25 ENCOUNTER — Telehealth: Payer: Self-pay

## 2017-07-25 ENCOUNTER — Other Ambulatory Visit: Payer: Self-pay | Admitting: Internal Medicine

## 2017-07-25 DIAGNOSIS — F411 Generalized anxiety disorder: Secondary | ICD-10-CM

## 2017-07-25 MED ORDER — FLUOXETINE HCL 20 MG PO CAPS: 20.0000 mg | ORAL_CAPSULE | Freq: Every day | ORAL | 5 refills | Status: DC

## 2017-07-25 NOTE — Telephone Encounter (Signed)
Pt told to call in 3-4 weeks to verify Prozac is helping. Told if working will call in refills. Pt states she is doing well on medication and [facility] [place]. Please Advise.

## 2017-07-25 NOTE — Telephone Encounter (Signed)
Pt informed of Rx sent.

## 2017-07-25 NOTE — Telephone Encounter (Signed)
Refilled for 6 months  

## 2017-09-25 ENCOUNTER — Emergency Department
Admission: EM | Admit: 2017-09-25 | Discharge: 2017-09-25 | Disposition: A | Discharge status: Home / Self Care | Payer: 59 | Attending: Emergency Medicine | Admitting: Emergency Medicine

## 2017-09-25 ENCOUNTER — Encounter: Payer: Self-pay | Admitting: *Deleted

## 2017-09-25 DIAGNOSIS — Z79899 Other long term (current) drug therapy: Secondary | ICD-10-CM | POA: Insufficient documentation

## 2017-09-25 DIAGNOSIS — B354 Tinea corporis: Secondary | ICD-10-CM

## 2017-09-25 MED ORDER — CLOTRIMAZOLE-BETAMETHASONE 1-0.05 % EX CREA: 1.0000 "application " | TOPICAL_CREAM | Freq: Two times a day (BID) | CUTANEOUS | 1 refills | Status: DC

## 2017-09-25 NOTE — Discharge Instructions (Signed)
Use the topical cream as directed. Keep the area clean, dry, and covered. Consider using a daily dandruff shampoo for washing. Follow-up with your provider or [facility] for further management.

## 2017-09-25 NOTE — ED Notes (Signed)
Pt discharged home after verbalizing understanding of discharge instructions; nad noted. 

## 2017-09-25 NOTE — ED Notes (Signed)
Pt presents with rash since August on her left wrist and hand. She states it is getting bigger. NAD noted.

## 2017-09-25 NOTE — ED Triage Notes (Signed)
Pt has itching red rash to left forearm.  Sx for several weeks    States after using a cream it isn't any better.  Pt has itching.

## 2017-09-28 NOTE — ED Provider Notes (Signed)
[facility] Emergency Department Provider Note ____________________________________________  Time seen: 1810  I have reviewed the triage vital signs and the nursing notes.  HISTORY  Chief Complaint  Rash  HPI Yvette Wise is a 36 y.o. female Presents to the ED for evaluation of persistent pruritic rash to the left forearm. Patient's Been Present for Several Weeks, and Has Not Responded or Improved with Over-The-Counter Antifungal Oral Cortisone Creams. She Denies Any Spread except to the Fingers on the Same Hand. She Denies Any Other Contacts, Exposures, or Allergies.   Past Medical History:  Diagnosis Date  . Depression     Patient Active Problem List   Diagnosis Date Noted  . Moderate episode of recurrent major depressive disorder (HCC) 06/12/2017  . Generalized anxiety disorder 05/03/2016    Past Surgical History:  Procedure Laterality Date  . CHOLECYSTECTOMY N/A 06/03/2016   Procedure: LAPAROSCOPIC CHOLECYSTECTOMY;  Surgeon: Leafy Ro, MD;  Location: [facility] ORS;  Service: General;  Laterality: N/A;    Prior to Admission medications   Medication Sig Start Date End Date Taking? Authorizing Provider  clotrimazole-betamethasone (LOTRISONE) cream Apply 1 application topically 2 (two) times daily. 09/25/17   Juanmanuel Marohl, Charlesetta Ivory, PA-C  FLUoxetine (PROZAC) 20 MG capsule Take 1 capsule (20 mg total) by mouth daily. 07/25/17   Reubin Milan, MD    Allergies Celexa [citalopram]  Family History  Problem Relation Age of Onset  . CVA Mother   . Depression Mother   . Anxiety disorder Mother   . Clotting disorder Mother     Social History Social History  Substance Use Topics  . Smoking status: Never Smoker  . Smokeless tobacco: Never Used  . Alcohol use No    Review of Systems  Constitutional: Negative for fever. Cardiovascular: Negative for chest pain. Respiratory: Negative for shortness of breath. Musculoskeletal: Negative for back  pain. Skin: Positive for rash. Neurological: Negative for headaches, focal weakness or numbness. ____________________________________________  PHYSICAL EXAM:  VITAL SIGNS: ED Triage Vitals  Enc Vitals Group     BP 09/25/17 1627 125/84     Pulse Rate 09/25/17 1627 85     Resp 09/25/17 1627 20     Temp 09/25/17 1627 99 F (37.2 C)     Temp Source 09/25/17 1627 Oral     SpO2 09/25/17 1627 100 %     Weight 09/25/17 1625 130 lb (59 kg)     Height 09/25/17 1625  (1.575 m)     Head Circumference --      Peak Flow --      Pain Score 09/25/17 1624 4     Pain Loc --      Pain Edu? --      Excl. in GC? --     Constitutional: Alert and oriented. Well appearing and in no distress. Head: Normocephalic and atraumatic. Eyes: Conjunctivae are normal. Normal extraocular movements Cardiovascular: Normal rate, regular rhythm. Normal distal pulses. Respiratory: Normal respiratory effort. No wheezes/rales/rhonchi. Musculoskeletal: Nontender with normal range of motion in all extremities.  Neurologic:  Normal gait without ataxia. Normal speech and language. No gross focal neurologic deficits are appreciated. Skin:  Skin is warm, dry and intact. Patient with a well demarcated, annular, erythematous, scaly rash to the volar left wrist. There is some minimal scale noted. She has similar smaller, satellite lesions noted to the dorsal fingers as well. No warmth, induration, fluctuance, or weeping is appreciated. No lymphangitis is noted. ____________________________________________  INITIAL IMPRESSION /  ASSESSMENT AND PLAN / ED COURSE  Patient need evaluation of what appears to be a tinea to the volar forearm and hand on the left. She is discharged with the prescriptionfor Lotrisoneuse as directed. She should follow-up with [facility] for [facility] management. ____________________________________________  FINAL CLINICAL IMPRESSION(S) / ED DIAGNOSES  Final diagnoses:  Tinea corporis       Karmen Stabs Charlesetta Ivory, PA-C 09/28/17 2351    Sharman Cheek, MD 09/30/17 0028

## 2018-01-16 ENCOUNTER — Ambulatory Visit (INDEPENDENT_AMBULATORY_CARE_PROVIDER_SITE_OTHER): Payer: Self-pay | Admitting: Internal Medicine

## 2018-01-16 ENCOUNTER — Encounter: Payer: Self-pay | Admitting: Internal Medicine

## 2018-01-16 DIAGNOSIS — F411 Generalized anxiety disorder: Secondary | ICD-10-CM

## 2018-01-16 MED ORDER — FLUOXETINE HCL 20 MG PO CAPS: 20.0000 mg | ORAL_CAPSULE | Freq: Every day | ORAL | 5 refills | Status: DC

## 2018-01-16 NOTE — Progress Notes (Signed)
Date:  01/16/2018   Name:  Yvette Wise   DOB:  [birth year]   MRN:  [id number]   Chief Complaint: Depression Depression         This is a chronic problem.  The current episode started more than 1 year ago.   The problem occurs intermittently.  The problem has been rapidly improving since onset.  Associated symptoms include no fatigue, no headaches and no suicidal ideas.     The symptoms are aggravated by social issues and family issues.  Past treatments include SSRIs - Selective serotonin reuptake inhibitors.  Compliance with treatment is good. Her son was recently diagnosed with Duchenne muscular dystrophy but is stable.  She feels that the Prozac has helped tremendously and wants to continue it.  She has gained some weight and is now trying to limit her diet.    Review of Systems  Constitutional: Positive for unexpected weight change. Negative for chills, fatigue and fever.  Eyes: Negative for visual disturbance.  Respiratory: Negative for chest tightness and shortness of breath.   Cardiovascular: Negative for chest pain and palpitations.  Gastrointestinal: Negative for diarrhea and nausea.  Neurological: Negative for dizziness and headaches.  Psychiatric/Behavioral: Positive for depression. Negative for dysphoric mood, sleep disturbance and suicidal ideas. The patient is not nervous/anxious.     Patient Active Problem List   Diagnosis Date Noted  . Moderate episode of recurrent major depressive disorder (HCC) 06/12/2017  . Generalized anxiety disorder 05/03/2016    Prior to Admission medications   Medication Sig Start Date End Date Taking? Authorizing Provider  clotrimazole-betamethasone (LOTRISONE) cream Apply 1 application topically 2 (two) times daily. 09/25/17  Yes Menshew, Charlesetta Ivory, PA-C  FLUoxetine (PROZAC) 20 MG capsule Take 1 capsule (20 mg total) by mouth daily. 07/25/17  Yes Reubin Milan, MD    Allergies  Allergen Reactions  . Celexa [Citalopram] Other  (See Comments)    Severe nausea, vomiting, diarrhea, chills and fatigue    Past Surgical History:  Procedure Laterality Date  . CHOLECYSTECTOMY N/A 06/03/2016   Procedure: LAPAROSCOPIC CHOLECYSTECTOMY;  Surgeon: Leafy Ro, MD;  Location: [facility] ORS;  Service: General;  Laterality: N/A;    Social History   Tobacco Use  . Smoking status: Never Smoker  . Smokeless tobacco: Never Used  Substance Use Topics  . Alcohol use: No    Alcohol/week: 0.0 oz  . Drug use: No     Medication list has been reviewed and updated.  PHQ 2/9 Scores 01/16/2018 06/12/2017 05/03/2016  PHQ - 2 Score 0 6 2  PHQ- 9 Score 0 16 5    Physical Exam  Constitutional: She is oriented to person, place, and time. She appears well-developed. No distress.  HENT:  Head: Normocephalic and atraumatic.  Neck: Normal range of motion. Neck supple. No thyromegaly present.  Cardiovascular: Normal rate, regular rhythm and normal heart sounds.  Pulmonary/Chest: Effort normal and breath sounds normal. No respiratory distress. She has no wheezes.  Musculoskeletal: She exhibits no edema.  Neurological: She is alert and oriented to person, place, and time.  Skin: Skin is warm and dry. No rash noted. There is erythema (due to dry skin on hands).  Psychiatric: She has a normal mood and affect. Her behavior is normal. Thought content normal.  Nursing note and vitals reviewed.   BP 104/60   Pulse 80   Ht 5\' 2"  (1.575 m)   Wt 152 lb (68.9 kg)   BMI 27.80 kg/m  Assessment and Plan: 1. Generalized anxiety disorder Doing well on current therapy - will continue Discussed weight gain - recommend exercise and modified diet - FLUoxetine (PROZAC) 20 MG capsule; Take 1 capsule (20 mg total) by mouth daily.  Dispense: 30 capsule; Refill: 5   Meds ordered this encounter  Medications  . FLUoxetine (PROZAC) 20 MG capsule    Sig: Take 1 capsule (20 mg total) by mouth daily.    Dispense:  30 capsule    Refill:  5    Partially  dictated using Animal nutritionistDragon software. Any errors are unintentional.  Bari EdwardLaura Samanthamarie Ezzell, MD [facility]  01/16/2018

## 2018-08-06 LAB — HM PAP SMEAR: HM Pap smear: NEGATIVE

## 2018-08-07 ENCOUNTER — Encounter: Payer: Self-pay | Admitting: Internal Medicine

## 2018-08-07 ENCOUNTER — Ambulatory Visit (INDEPENDENT_AMBULATORY_CARE_PROVIDER_SITE_OTHER): Payer: Self-pay | Admitting: Internal Medicine

## 2018-08-07 VITALS — BP 112/74 | HR 98 | Ht 62.0 in | Wt 156.4 lb

## 2018-08-07 DIAGNOSIS — F331 Major depressive disorder, recurrent, moderate: Secondary | ICD-10-CM

## 2018-08-07 DIAGNOSIS — F411 Generalized anxiety disorder: Secondary | ICD-10-CM

## 2018-08-07 DIAGNOSIS — R635 Abnormal weight gain: Secondary | ICD-10-CM

## 2018-08-07 MED ORDER — FLUOXETINE HCL 20 MG PO CAPS: 20.0000 mg | ORAL_CAPSULE | Freq: Every day | ORAL | 5 refills | Status: DC

## 2018-08-07 NOTE — Progress Notes (Signed)
Date:  08/07/2018   Name:  Yvette CannonJennifer L Wise   DOB:  02/26/1981   MRN:  [id number]   Chief Complaint: Anxiety Anxiety  Presents for follow-up visit. Symptoms include palpitations (rarely). Patient reports no chest pain, depressed mood, dizziness, excessive worry, nervous/anxious behavior, shortness of breath or suicidal ideas. Symptoms occur rarely.    Depression         This is a chronic problem.  Associated symptoms include no fatigue, not irritable, no appetite change, no headaches, not sad and no suicidal ideas.     The symptoms are aggravated by nothing.  Past treatments include SSRIs - Selective serotonin reuptake inhibitors.  Compliance with treatment is good.  Previous treatment provided significant relief.  Past medical history includes anxiety.    She gained quite a bit of weight last year.  This year only a few pounds.  She is not exercising.  She has some mild fatigue with exertion but thinks mostly she is out of shape.  She has a son dx'd with MD and she was told that as a carrier she should have an EKG when she gets into her 1640's.  She denies chest pains currently.    Review of Systems  Constitutional: Negative for appetite change, fatigue and fever.  HENT: Negative for trouble swallowing.   Respiratory: Negative for chest tightness and shortness of breath.   Cardiovascular: Positive for palpitations (rarely). Negative for chest pain and leg swelling.  Musculoskeletal: Negative for arthralgias.  Neurological: Negative for dizziness and headaches.  Psychiatric/Behavioral: Positive for depression. Negative for suicidal ideas. The patient is not nervous/anxious.     Patient Active Problem List   Diagnosis Date Noted  . Moderate episode of recurrent major depressive disorder (HCC) 06/12/2017  . Generalized anxiety disorder 05/03/2016    Allergies  Allergen Reactions  . Celexa [Citalopram] Other (See Comments)    Severe nausea, vomiting, diarrhea, chills and fatigue     Past Surgical History:  Procedure Laterality Date  . CHOLECYSTECTOMY N/A 06/03/2016   Procedure: LAPAROSCOPIC CHOLECYSTECTOMY;  Surgeon: Leafy Roiego F Pabon, MD;  Location: [facility] ORS;  Service: General;  Laterality: N/A;    Social History   Tobacco Use  . Smoking status: Never Smoker  . Smokeless tobacco: Never Used  Substance Use Topics  . Alcohol use: No    Alcohol/week: 0.0 standard drinks  . Drug use: No     Medication list has been reviewed and updated.  Current Meds  Medication Sig  . clotrimazole-betamethasone (LOTRISONE) cream Apply 1 application topically 2 (two) times daily.  Marland Kitchen. FLUoxetine (PROZAC) 20 MG capsule Take 1 capsule (20 mg total) by mouth daily.    PHQ 2/9 Scores 08/07/2018 01/16/2018 06/12/2017 05/03/2016  PHQ - 2 Score 0 0 6 2  PHQ- 9 Score 1 0 16 5   GAD 7 : Generalized Anxiety Score 08/07/2018  Nervous, Anxious, on Edge 0  Control/stop worrying 0  Worry too much - different things 0  Trouble relaxing 0  Restless 0  Easily annoyed or irritable 0  Afraid - awful might happen 0  Total GAD 7 Score 0  Anxiety Difficulty Not difficult at all     Physical Exam  Constitutional: She is oriented to person, place, and time. She appears well-developed. She is not irritable. No distress.  HENT:  Head: Normocephalic and atraumatic.  Neck: Normal range of motion. Neck supple. No thyromegaly present.  Cardiovascular: Normal rate, regular rhythm and normal heart sounds.  Pulmonary/Chest: Effort normal  and breath sounds normal. No respiratory distress.  Musculoskeletal: Normal range of motion.  Lymphadenopathy:    She has no cervical adenopathy.  Neurological: She is alert and oriented to person, place, and time.  Skin: Skin is warm and dry. No rash noted.  Psychiatric: She has a normal mood and affect. Her behavior is normal. Thought content normal.  Nursing note and vitals reviewed.   BP 112/74 (BP Location: Right Arm, Patient Position: Sitting, Cuff Size:  Normal)   Pulse 98   Ht 5\' 2"  (1.575 m)   Wt 156 lb 6.4 oz (70.9 kg)   SpO2 98%   BMI 28.61 kg/m   Assessment and Plan: 1. Moderate episode of recurrent major depressive disorder (HCC) Doing well on Prozac  2. Generalized anxiety disorder controlled - FLUoxetine (PROZAC) 20 MG capsule; Take 1 capsule (20 mg total) by mouth daily.  Dispense: 30 capsule; Refill: 5  3. Weight gain finding Will check labs Recommend regular exercise - Comprehensive metabolic panel - TSH   Meds ordered this encounter  Medications  . FLUoxetine (PROZAC) 20 MG capsule    Sig: Take 1 capsule (20 mg total) by mouth daily.    Dispense:  30 capsule    Refill:  5    Partially dictated using Animal nutritionistDragon software. Any errors are unintentional.  Bari EdwardLaura Medrith Veillon, MD [facility]  08/07/2018

## 2018-08-08 LAB — COMPREHENSIVE METABOLIC PANEL
A/G RATIO: 1.6 (ref 1.2–2.2)
ALBUMIN: 4.3 g/dL (ref 3.5–5.5)
ALK PHOS: 79 IU/L (ref 39–117)
ALT: 10 IU/L (ref 0–32)
AST: 17 IU/L (ref 0–40)
BILIRUBIN TOTAL: 0.4 mg/dL (ref 0.0–1.2)
BUN / CREAT RATIO: 17 (ref 9–23)
BUN: 9 mg/dL (ref 6–20)
CHLORIDE: 104 mmol/L (ref 96–106)
CO2: 20 mmol/L (ref 20–29)
Calcium: 9.7 mg/dL (ref 8.7–10.2)
Creatinine, Ser: 0.52 mg/dL — ABNORMAL LOW (ref 0.57–1.00)
GFR calc non Af Amer: 124 mL/min/{1.73_m2} (ref >59)
GFR, EST AFRICAN AMERICAN: 143 mL/min/{1.73_m2} (ref >59)
GLOBULIN, TOTAL: 2.7 g/dL (ref 1.5–4.5)
GLUCOSE: 82 mg/dL (ref 65–99)
Potassium: 4.7 mmol/L (ref 3.5–5.2)
SODIUM: 137 mmol/L (ref 134–144)
TOTAL PROTEIN: 7 g/dL (ref 6.0–8.5)

## 2018-08-08 LAB — TSH: TSH: 1.3 u[IU]/mL (ref 0.450–4.500)

## 2019-01-30 ENCOUNTER — Ambulatory Visit (INDEPENDENT_AMBULATORY_CARE_PROVIDER_SITE_OTHER): Payer: Self-pay | Admitting: Internal Medicine

## 2019-01-30 ENCOUNTER — Encounter: Payer: Self-pay | Admitting: Internal Medicine

## 2019-01-30 ENCOUNTER — Other Ambulatory Visit: Payer: Self-pay

## 2019-01-30 DIAGNOSIS — F411 Generalized anxiety disorder: Secondary | ICD-10-CM

## 2019-01-30 MED ORDER — FLUOXETINE HCL 20 MG PO CAPS: 20.0000 mg | ORAL_CAPSULE | Freq: Every day | ORAL | 3 refills | Status: DC

## 2019-01-30 NOTE — Progress Notes (Signed)
Date:  01/30/2019   Name:  Yvette CannonJennifer L Wise   DOB:  03/30/1981   MRN:  [id number]   Chief Complaint: Anxiety  Anxiety  Presents for follow-up visit. Patient reports no chest pain, depressed mood, dizziness, excessive worry, irritability, nervous/anxious behavior, palpitations, shortness of breath or suicidal ideas. Symptoms occur rarely. The severity of symptoms is mild (doing well on current medications).      Review of Systems  Constitutional: Negative for chills, fatigue, fever, irritability and unexpected weight change.  Respiratory: Negative for chest tightness and shortness of breath.   Cardiovascular: Negative for chest pain and palpitations.  Gastrointestinal: Negative for abdominal pain, constipation and diarrhea.  Neurological: Negative for dizziness and headaches.  Psychiatric/Behavioral: Negative for dysphoric mood, self-injury and suicidal ideas. The patient is not nervous/anxious.     Patient Active Problem List   Diagnosis Date Noted  . Moderate episode of recurrent major depressive disorder (HCC) 06/12/2017  . Generalized anxiety disorder 05/03/2016    Allergies  Allergen Reactions  . Celexa [Citalopram] Other (See Comments)    Severe nausea, vomiting, diarrhea, chills and fatigue    Past Surgical History:  Procedure Laterality Date  . CHOLECYSTECTOMY N/A 06/03/2016   Procedure: LAPAROSCOPIC CHOLECYSTECTOMY;  Surgeon: Leafy Roiego F Pabon, MD;  Location: [facility] ORS;  Service: General;  Laterality: N/A;    Social History   Tobacco Use  . Smoking status: Never Smoker  . Smokeless tobacco: Never Used  Substance Use Topics  . Alcohol use: No    Alcohol/week: 0.0 standard drinks  . Drug use: No     Medication list has been reviewed and updated.  Current Meds  Medication Sig  . FLUoxetine (PROZAC) 20 MG capsule Take 1 capsule (20 mg total) by mouth daily.    PHQ 2/9 Scores 01/30/2019 08/07/2018 01/16/2018 06/12/2017  PHQ - 2 Score 0 0 0 6  PHQ- 9 Score 1 1  0 16   Wt Readings from Last 3 Encounters:  01/30/19 157 lb 12.8 oz (71.6 kg)  08/07/18 156 lb 6.4 oz (70.9 kg)  01/16/18 152 lb (68.9 kg)    Physical Exam Vitals signs and nursing note reviewed.  Constitutional:      General: She is not in acute distress.    Appearance: She is well-developed.  HENT:     Head: Normocephalic and atraumatic.  Eyes:     Pupils: Pupils are equal, round, and reactive to light.  Neck:     Musculoskeletal: Normal range of motion and neck supple.  Cardiovascular:     Rate and Rhythm: Normal rate and regular rhythm.  Pulmonary:     Effort: Pulmonary effort is normal. No respiratory distress.     Breath sounds: Normal breath sounds. No wheezing.  Musculoskeletal:     Right lower leg: No edema.     Left lower leg: No edema.  Lymphadenopathy:     Cervical: No cervical adenopathy.  Skin:    General: Skin is warm and dry.     Findings: No rash.  Neurological:     Mental Status: She is alert and oriented to person, place, and time.  Psychiatric:        Behavior: Behavior normal.        Thought Content: Thought content normal.     BP 118/68   Pulse 62   Temp 98.3 F (36.8 C) (Oral)   Ht 5\' 2"  (1.575 m)   Wt 157 lb 12.8 oz (71.6 kg)   BMI 28.86 kg/m  Assessment and Plan: 1. Generalized anxiety disorder Doing well, continue current therapy - FLUoxetine (PROZAC) 20 MG capsule; Take 1 capsule (20 mg total) by mouth daily.  Dispense: 90 capsule; Refill: 3   Partially dictated using Animal nutritionist. Any errors are unintentional.  Bari Edward, MD [facility]  01/30/2019

## 2019-07-17 ENCOUNTER — Ambulatory Visit: Payer: Self-pay

## 2019-07-17 ENCOUNTER — Other Ambulatory Visit: Payer: Self-pay

## 2019-07-17 VITALS — BP 117/81 | Ht 61.0 in | Wt 155.5 lb

## 2019-07-17 DIAGNOSIS — Z3009 Encounter for other general counseling and advice on contraception: Secondary | ICD-10-CM

## 2019-07-17 DIAGNOSIS — Z3041 Encounter for surveillance of contraceptive pills: Secondary | ICD-10-CM

## 2019-07-17 MED ORDER — NORETHINDRONE 0.35 MG PO TABS: 1.0000 | ORAL_TABLET | Freq: Every day | ORAL | 0 refills | Status: DC

## 2019-07-17 NOTE — Progress Notes (Signed)
Last physical with [facility] was 08/06/2018 (dispensed #13 packs Norethindrone). Pt needs additional supply to last until she gets physical. Per verbal order by Antoine Primas, PA, dispensed #4 packs of Norethindrone to pt, to take 1 tab at the same time every day.

## 2019-11-20 ENCOUNTER — Other Ambulatory Visit: Payer: Self-pay

## 2019-11-20 ENCOUNTER — Ambulatory Visit (LOCAL_COMMUNITY_HEALTH_CENTER): Payer: Self-pay

## 2019-11-20 VITALS — BP 130/88 | Ht 61.0 in | Wt 157.0 lb

## 2019-11-20 DIAGNOSIS — Z3009 Encounter for other general counseling and advice on contraception: Secondary | ICD-10-CM

## 2019-11-20 DIAGNOSIS — Z3041 Encounter for surveillance of contraceptive pills: Secondary | ICD-10-CM

## 2019-11-20 MED ORDER — NORETHINDRONE 0.35 MG PO TABS: 1.0000 | ORAL_TABLET | Freq: Every day | ORAL | 0 refills | Status: DC

## 2019-11-20 NOTE — Progress Notes (Signed)
Last physical with [facility] was 08/06/2009, and RV for OCP supply on 07/17/2019. Consulted with provider regarding pt request for OCP supply. Per verbal order per Hassell Done, FNP, dispensed #2 packs of Micronor/Norethindrone to pt and counseled pt to schedule physical in Jan 2021 ([facility] plans to resume physicals in Jan 2021).

## 2020-01-16 ENCOUNTER — Ambulatory Visit (LOCAL_COMMUNITY_HEALTH_CENTER): Payer: Self-pay

## 2020-01-16 ENCOUNTER — Encounter: Payer: Self-pay | Admitting: Physician Assistant

## 2020-01-16 ENCOUNTER — Ambulatory Visit (LOCAL_COMMUNITY_HEALTH_CENTER): Payer: Self-pay | Admitting: Physician Assistant

## 2020-01-16 ENCOUNTER — Other Ambulatory Visit: Payer: Self-pay

## 2020-01-16 VITALS — BP 129/84 | Ht 61.0 in | Wt 156.4 lb

## 2020-01-16 DIAGNOSIS — Z309 Encounter for contraceptive management, unspecified: Secondary | ICD-10-CM

## 2020-01-16 DIAGNOSIS — Z23 Encounter for immunization: Secondary | ICD-10-CM

## 2020-01-16 LAB — HM PAP SMEAR: HM Pap smear: NEGATIVE

## 2020-01-16 LAB — RESULTS CONSOLE HPV: CHL HPV: NEGATIVE

## 2020-01-16 MED ORDER — NORGESTIM-ETH ESTRAD TRIPHASIC 0.18/0.215/0.25 MG-25 MCG PO TABS: 1.0000 | ORAL_TABLET | Freq: Every day | ORAL | 11 refills | Status: DC

## 2020-01-16 NOTE — Progress Notes (Signed)
Per client, does not desire any STD testing today. Jossie Ng, RN

## 2020-01-16 NOTE — Progress Notes (Signed)
Family Planning Visit- Repeat Yearly Visit  Subjective:  Yvette Wise is a 39 y.o. being seen tod ay for an well woman visit and to discuss family planning options.  Last physical 09/06/18 with Pap = NIL/HRHPV Neg  She is currently using oral progesterone-only contraceptive for pregnancy prevention. Patient reports she does not wants a pregnancy in the next year. Patient  has Generalized anxiety disorder and Moderate episode of recurrent major depressive disorder (HCC) on their problem list.  Chief Complaint  Patient presents with  . Contraception    ops    Patient reports she feels well, LMP 01/14/20, no new medical problems, surgeries/injuries or new family medical history since last annual physical.   Patient denies concerns about STI. Denies smoking.    Does the patient desire a pregnancy in the next year? (OKQ flowsheet)  See flowsheet for other program required questions.   Body mass index is 29.55 kg/m. - Patient is eligible for diabetes screening based on BMI and age >22?  not applicable HA1C ordered? no  Patient reports 1 of partners in last year. Desires STI screening?  No - low risk, no sx  Does the patient have a current or past history of drug use? No   No components found for: HCV]   Health Maintenance Due  Topic Date Due  . INFLUENZA VACCINE  07/20/2019    ROS  The following portions of the patient's history were reviewed and updated as appropriate: allergies, current medications, past family history, past medical history, past social history, past surgical history and problem list. Problem list updated.  Objective:   Vitals:   01/16/20 1317  BP: 129/84  Weight: 156 lb 6.4 oz (70.9 kg)  Height: 5\' 1"  (1.549 m)  BP 129/84  Physical Exam Constitutional:      Appearance: Normal appearance. She is not ill-appearing.  Pulmonary:     Effort: Pulmonary effort is normal.  Neurological:     Mental Status: She is alert and oriented to person, place, and  time.  Psychiatric:        Mood and Affect: Mood normal.        Behavior: Behavior normal.        Thought Content: Thought content normal.        Judgment: Judgment normal.       Assessment and Plan:  Yvette Wise is a 39 y.o. female presenting to the [facility] Department for an initial well woman exam/family planning visit  Contraception counseling: Reviewed all forms of birth control options in the tiered based approach. available including abstinence; over the counter/barrier methods; hormonal contraceptive medication including pill, patch, ring, injection,contraceptive implant; hormonal and nonhormonal IUDs; permanent sterilization options including vasectomy and the various tubal sterilization modalities. Risks, benefits, and typical effectiveness rates were reviewed.  Questions were answered.  Written information was also given to the patient to review.  Patient desires POP, this was prescribed for patient. She will follow up in  12 mo for surveillance.  She was told to call with any further questions, or with any concerns about this method of contraception.  Emphasized use of condoms 100% of the time for STI prevention.  Patient was not offered ECP.  There are no diagnoses linked to this encounter.  1. Contraceptive management Continue Micronor, 1 po qd, please give #13 packs. Routine Td today. Next Pap due 2024.  Return in about 1 year (around 01/15/2021) for annual well-woman exam.  No future appointments.  01/17/2021,  PA-C

## 2020-01-22 ENCOUNTER — Other Ambulatory Visit: Payer: Self-pay | Admitting: Internal Medicine

## 2020-01-22 DIAGNOSIS — F411 Generalized anxiety disorder: Secondary | ICD-10-CM

## 2020-01-27 ENCOUNTER — Ambulatory Visit: Payer: Self-pay | Admitting: Internal Medicine

## 2020-04-27 ENCOUNTER — Other Ambulatory Visit: Payer: Self-pay | Admitting: Internal Medicine

## 2020-04-27 DIAGNOSIS — F411 Generalized anxiety disorder: Secondary | ICD-10-CM

## 2020-04-27 NOTE — Telephone Encounter (Signed)
Courtesy refill.  Pt has appt scheduled 05/04/2020

## 2020-05-04 ENCOUNTER — Encounter: Payer: Self-pay | Admitting: Internal Medicine

## 2020-05-04 ENCOUNTER — Ambulatory Visit (INDEPENDENT_AMBULATORY_CARE_PROVIDER_SITE_OTHER): Payer: Self-pay | Admitting: Internal Medicine

## 2020-05-04 ENCOUNTER — Other Ambulatory Visit: Payer: Self-pay

## 2020-05-04 DIAGNOSIS — F411 Generalized anxiety disorder: Secondary | ICD-10-CM

## 2020-05-04 MED ORDER — FLUOXETINE HCL 20 MG PO CAPS: 20.0000 mg | ORAL_CAPSULE | Freq: Every day | ORAL | 3 refills | Status: DC

## 2020-05-04 NOTE — Progress Notes (Signed)
Date:  05/04/2020   Name:  Yvette Wise   DOB:  [birth year]   MRN:  [id number]   Chief Complaint: Anxiety (Follow up. Med Refill.)  Anxiety Presents for follow-up visit. Patient reports no chest pain, depressed mood, dizziness, excessive worry, hyperventilation, insomnia, irritability, nervous/anxious behavior, palpitations, shortness of breath or suicidal ideas. The quality of sleep is good.   Compliance with medications is 76-100%. Treatment side effects: no side effects.  PHQ-9 and GAD7 reviewed.  Lab Results  Component Value Date   CREATININE 0.52 (L) 08/07/2018   BUN 9 08/07/2018   NA 137 08/07/2018   K 4.7 08/07/2018   CL 104 08/07/2018   CO2 20 08/07/2018   No results found for: CHOL, HDL, LDLCALC, LDLDIRECT, TRIG, CHOLHDL Lab Results  Component Value Date   TSH 1.300 08/07/2018   No results found for: HGBA1C Lab Results  Component Value Date   WBC 10.6 08/13/2015   HGB 13.3 08/13/2015   HCT 39.0 08/13/2015   MCV 84.2 08/13/2015   PLT 337 08/13/2015   Lab Results  Component Value Date   ALT 10 08/07/2018   AST 17 08/07/2018   ALKPHOS 79 08/07/2018   BILITOT 0.4 08/07/2018     Review of Systems  Constitutional: Negative for chills, fatigue, fever and irritability.  Respiratory: Negative for cough, chest tightness, shortness of breath and wheezing.   Cardiovascular: Negative for chest pain and palpitations.  Genitourinary: Negative for menstrual problem.  Neurological: Negative for dizziness and headaches.  Psychiatric/Behavioral: Negative for suicidal ideas. The patient is not nervous/anxious and does not have insomnia.     Patient Active Problem List   Diagnosis Date Noted  . Moderate episode of recurrent major depressive disorder ([place]) 06/12/2017  . Generalized anxiety disorder 05/03/2016    Allergies  Allergen Reactions  . Celexa [Citalopram] Other (See Comments)    Severe nausea, vomiting, diarrhea, chills and fatigue    Past Surgical  History:  Procedure Laterality Date  . CHOLECYSTECTOMY N/A 06/03/2016   Procedure: LAPAROSCOPIC CHOLECYSTECTOMY;  Surgeon: Jules Husbands, MD;  Location: [facility] ORS;  Service: General;  Laterality: N/A;    Social History   Tobacco Use  . Smoking status: Never Smoker  . Smokeless tobacco: Never Used  Substance Use Topics  . Alcohol use: Not Currently    Alcohol/week: 0.0 standard drinks    Comment: Last ETOH use 6 months ago  . Drug use: Never     Medication list has been reviewed and updated.  Current Meds  Medication Sig  . FLUoxetine (PROZAC) 20 MG capsule Take 1 capsule by mouth once daily  . ibuprofen (ADVIL) 200 MG tablet Take 200 mg by mouth every 6 (six) hours as needed.  . Norgestimate-Ethinyl Estradiol Triphasic 0.18/0.215/0.25 MG-25 MCG tab Take 1 tablet by mouth daily.    PHQ 2/9 Scores 05/04/2020 01/30/2019 08/07/2018 01/16/2018  PHQ - 2 Score 0 0 0 0  PHQ- 9 Score 0 1 1 0   GAD 7 : Generalized Anxiety Score 05/04/2020 01/30/2019 08/07/2018  Nervous, Anxious, on Edge 0 0 0  Control/stop worrying 0 0 0  Worry too much - different things 0 0 0  Trouble relaxing 0 0 0  Restless 0 0 0  Easily annoyed or irritable 0 1 0  Afraid - awful might happen 0 0 0  Total GAD 7 Score 0 1 0  Anxiety Difficulty Not difficult at all Not difficult at all Not difficult at all  BP Readings from Last 3 Encounters:  05/04/20 122/84  01/16/20 129/84  11/20/19 130/88    Physical Exam Vitals and nursing note reviewed.  Constitutional:      General: She is not in acute distress.    Appearance: Normal appearance. She is well-developed.  HENT:     Head: Normocephalic and atraumatic.  Cardiovascular:     Rate and Rhythm: Normal rate and regular rhythm.     Pulses: Normal pulses.     Heart sounds: No murmur.  Pulmonary:     Effort: Pulmonary effort is normal. No respiratory distress.     Breath sounds: No wheezing or rhonchi.  Musculoskeletal:     Cervical back: Normal range of  motion.     Right lower leg: No edema.     Left lower leg: No edema.  Lymphadenopathy:     Cervical: No cervical adenopathy.  Skin:    General: Skin is warm and dry.     Capillary Refill: Capillary refill takes less than 2 seconds.     Findings: No rash.  Neurological:     General: No focal deficit present.     Mental Status: She is alert and oriented to person, place, and time.  Psychiatric:        Mood and Affect: Mood normal.        Behavior: Behavior normal.        Thought Content: Thought content normal.     Wt Readings from Last 3 Encounters:  05/04/20 158 lb (71.7 kg)  01/16/20 156 lb 6.4 oz (70.9 kg)  11/20/19 157 lb (71.2 kg)    BP 122/84   Pulse 89   Temp 98 F (36.7 C) (Temporal)   Ht 5\' 2"  (1.575 m)   Wt 158 lb (71.7 kg)   SpO2 97%   BMI 28.90 kg/m   Assessment and Plan: 1. Generalized anxiety disorder Doing very well on prozac daily with excellent compliance and control of symptoms. Will continue same dosing. Follow up annually and as needed - FLUoxetine (PROZAC) 20 MG capsule; Take 1 capsule (20 mg total) by mouth daily.  Dispense: 90 capsule; Refill: 3   Partially dictated using . Any errors are unintentional.  Animal nutritionist, MD [facility]  05/04/2020

## 2020-12-29 ENCOUNTER — Encounter: Payer: Self-pay | Admitting: Internal Medicine

## 2020-12-29 ENCOUNTER — Other Ambulatory Visit: Payer: Self-pay

## 2020-12-29 ENCOUNTER — Ambulatory Visit: Payer: Self-pay | Admitting: Internal Medicine

## 2020-12-29 VITALS — BP 124/86 | HR 91 | Temp 98.4°F | Ht 62.0 in | Wt 158.0 lb

## 2020-12-29 DIAGNOSIS — K047 Periapical abscess without sinus: Secondary | ICD-10-CM

## 2020-12-29 DIAGNOSIS — H6991 Unspecified Eustachian tube disorder, right ear: Secondary | ICD-10-CM

## 2020-12-29 MED ORDER — AMOXICILLIN-POT CLAVULANATE 875-125 MG PO TABS: 1.0000 | ORAL_TABLET | Freq: Two times a day (BID) | ORAL | 0 refills | Status: AC

## 2020-12-29 NOTE — Progress Notes (Signed)
Date:  12/29/2020   Name:  Yvette Wise   DOB:  Oct 04, 1981   MRN:  [id number]   Chief Complaint: Ear Pain (X3-4 days, right ear, constant pain, no drainage, no fever ) and Sore Throat (X2 days, hurts to swallow on right side)  Otalgia  There is pain in the right ear. The problem has been unchanged. There has been no fever. The pain is moderate. Associated symptoms include a sore throat. Pertinent negatives include no coughing, diarrhea, ear discharge, headaches, hearing loss or rash. She has tried nothing for the symptoms. several broken teeth and perhaps wisdome tooth coming in    Lab Results  Component Value Date   CREATININE 0.52 (L) 08/07/2018   BUN 9 08/07/2018   NA 137 08/07/2018   K 4.7 08/07/2018   CL 104 08/07/2018   CO2 20 08/07/2018   No results found for: CHOL, HDL, LDLCALC, LDLDIRECT, TRIG, CHOLHDL Lab Results  Component Value Date   TSH 1.300 08/07/2018   No results found for: HGBA1C Lab Results  Component Value Date   WBC 10.6 08/13/2015   HGB 13.3 08/13/2015   HCT 39.0 08/13/2015   MCV 84.2 08/13/2015   PLT 337 08/13/2015   Lab Results  Component Value Date   ALT 10 08/07/2018   AST 17 08/07/2018   ALKPHOS 79 08/07/2018   BILITOT 0.4 08/07/2018     Review of Systems  Constitutional: Negative for chills, fatigue and fever.  HENT: Positive for congestion (nasal discharge), ear pain and sore throat. Negative for ear discharge, hearing loss, postnasal drip and trouble swallowing.   Respiratory: Negative for cough and wheezing.   Cardiovascular: Negative for chest pain and palpitations.  Gastrointestinal: Negative for diarrhea.  Skin: Negative for rash.  Neurological: Negative for headaches.    Patient Active Problem List   Diagnosis Date Noted  . Generalized anxiety disorder 05/03/2016    Allergies  Allergen Reactions  . Celexa [Citalopram] Other (See Comments)    Severe nausea, vomiting, diarrhea, chills and fatigue    Past Surgical  History:  Procedure Laterality Date  . CHOLECYSTECTOMY N/A 06/03/2016   Procedure: LAPAROSCOPIC CHOLECYSTECTOMY;  Surgeon: Leafy Ro, MD;  Location: [facility] ORS;  Service: General;  Laterality: N/A;    Social History   Tobacco Use  . Smoking status: Never Smoker  . Smokeless tobacco: Never Used  Vaping Use  . Vaping Use: Never used  Substance Use Topics  . Alcohol use: Not Currently    Alcohol/week: 0.0 standard drinks    Comment: Last ETOH use 6 months ago  . Drug use: Never     Medication list has been reviewed and updated.  Current Meds  Medication Sig  . FLUoxetine (PROZAC) 20 MG capsule Take 1 capsule (20 mg total) by mouth daily.  Marland Kitchen ibuprofen (ADVIL) 200 MG tablet Take 200 mg by mouth every 6 (six) hours as needed.  . Norgestimate-Ethinyl Estradiol Triphasic 0.18/0.215/0.25 MG-25 MCG tab Take 1 tablet by mouth daily.    PHQ 2/9 Scores 12/29/2020 05/04/2020 01/30/2019 08/07/2018  PHQ - 2 Score 0 0 0 0  PHQ- 9 Score 0 0 1 1    GAD 7 : Generalized Anxiety Score 12/29/2020 05/04/2020 01/30/2019 08/07/2018  Nervous, Anxious, on Edge 0 0 0 0  Control/stop worrying 0 0 0 0  Worry too much - different things 0 0 0 0  Trouble relaxing 0 0 0 0  Restless 0 0 0 0  Easily annoyed or irritable  0 0 1 0  Afraid - awful might happen 0 0 0 0  Total GAD 7 Score 0 0 1 0  Anxiety Difficulty - Not difficult at all Not difficult at all Not difficult at all    BP Readings from Last 3 Encounters:  12/29/20 124/86  05/04/20 122/84  01/16/20 129/84    Physical Exam Constitutional:      Appearance: She is well-developed.  HENT:     Right Ear: Hearing, tympanic membrane and ear canal normal.     Left Ear: Hearing, tympanic membrane and ear canal normal.     Nose:     Right Sinus: No maxillary sinus tenderness or frontal sinus tenderness.     Left Sinus: No maxillary sinus tenderness or frontal sinus tenderness.     Mouth/Throat:     Dentition: Dental tenderness and dental caries  present. No dental abscesses.     Tonsils: No tonsillar exudate. 4+ on the right. 4+ on the left.  Neck:     Thyroid: No thyromegaly.  Cardiovascular:     Rate and Rhythm: Normal rate and regular rhythm.  Pulmonary:     Effort: Pulmonary effort is normal.     Breath sounds: No wheezing or rhonchi.  Musculoskeletal:     Cervical back: Normal range of motion.  Lymphadenopathy:     Cervical: Cervical adenopathy present.  Neurological:     Mental Status: She is alert.     Wt Readings from Last 3 Encounters:  12/29/20 158 lb (71.7 kg)  05/04/20 158 lb (71.7 kg)  01/16/20 156 lb 6.4 oz (70.9 kg)    BP 124/86   Pulse 91   Temp 98.4 F (36.9 C) (Oral)   Ht 5\' 2"  (1.575 m)   Wt 158 lb (71.7 kg)   LMP 12/16/2020 (Approximate)   SpO2 99%   BMI 28.90 kg/m   Assessment and Plan: 1. Disorder of right eustachian tube Use warm compresses; sudafed for several days if helpful  2. Dental infection Causing enlarged lymph node on right and ear discomfort Take advil tid; use warm compresses - amoxicillin-clavulanate (AUGMENTIN) 875-125 MG tablet; Take 1 tablet by mouth 2 (two) times daily for 10 days.  Dispense: 20 tablet; Refill: 0   Partially dictated using 12/18/2020. Any errors are unintentional.  Animal nutritionist, MD [facility]  12/29/2020

## 2021-01-01 ENCOUNTER — Other Ambulatory Visit: Payer: Self-pay

## 2021-01-01 ENCOUNTER — Ambulatory Visit (LOCAL_COMMUNITY_HEALTH_CENTER): Payer: Self-pay | Admitting: Family Medicine

## 2021-01-01 VITALS — BP 140/91 | HR 80 | Ht 62.0 in | Wt 155.0 lb

## 2021-01-01 DIAGNOSIS — Z01419 Encounter for gynecological examination (general) (routine) without abnormal findings: Secondary | ICD-10-CM

## 2021-01-01 MED ORDER — NORETHINDRONE 0.35 MG PO TABS: 1.0000 | ORAL_TABLET | Freq: Every day | ORAL | 13 refills | Status: DC

## 2021-01-01 NOTE — Progress Notes (Signed)
[facility] [address] [place], [place] [address] Main Number: [phone]  Family Planning Visit- Initial Visit  Subjective:  Yvette Wise is a 40 y.o.  G2P2002  being seen today for an initial well woman visit and to discuss family planning options. Patient reports they do not want a pregnancy in the next year.   Chief Complaint  Patient presents with  . Annual Exam    Pt has Generalized anxiety disorder on their problem list.   HPI  Patient reports she is here for physical exam and POP refill. Taking micronor instead of COC d/t migraine HA and BP.   Pt denies any of the following contraindications to progesterone only contraceptive use: -Known or suspected pregnancy.  -Known or suspected breast cancer. -Undiagnosed abnormal uterine bleeding. -Benign or malignant liver tumors, severe cirrhosis, or acute liver disease. -Pt is also not taking anticonvulsants, nor has undergone malabsorptive bariatric surgeries.    Patient's last menstrual period was 12/21/2020 (approximate). Last sex: 12/28/20 BCM: micronor, continuous use Pt desires EC? n/a  Last pap per pt/review of record: 01/16/20 = NIL, neg HPV.   Last HIV test per pt/review of record: 2010 Last tetanus vaccine: 1 year ago per pt Covid vaccine: pt declines  Last breast exam: unsure Personal/family hx breast cancer? no  Patient reports 1 partner(s) in last year. Do they desire STI screening (if no, why not)? No, pt declines  Does the patient desire a pregnancy in the next year? no   40 y.o., Body mass index is 28.35 kg/m. - Is patient eligible for HA1C diabetes screening based on BMI and age >99?  no  HCV screening;       Has patient been screened once for HCV in the past?  no  No results found for: HCVAB      Does the patient have current drug use, have a partner with drug use, and/or has been incarcerated since last result? no If yes-- Screen for  HCV through [facility]   Does the patient meet criteria for HBV testing? no Criteria:  -Household, sexual or needle sharing contact with HBV -History of drug use -HIV positive -Those with known Hep C  PHQ-2 score 0  See flowsheet for other program required questions.   Health Maintenance Due  Topic Date Due  . Hepatitis C Screening  Never done    ROS 10 point review of systems is otherwise negative except as mentioned in HPI and listed below: HA: migraines.   The following portions of the patient's history were reviewed and updated as appropriate: allergies, current medications, past family history, past medical history, past social history, past surgical history and problem list. Problem list updated.   See flowsheet for other program required questions.  Objective:   Vitals:   01/01/21 1507  BP: (!) 140/91  Pulse: 80  Weight: 155 lb (70.3 kg)  Height: 5\' 2"  (1.575 m)    Physical Exam  Vitals and nursing note reviewed.  Constitutional:      Appearance: Normal appearance.  HENT:     Head: Normocephalic and atraumatic.     Mouth/Throat:     Mouth: Mucous membranes are moist.     Pharynx: Oropharynx is clear. No oropharyngeal exudate or posterior oropharyngeal erythema.  Eyes:     Conjunctiva/sclera: Conjunctivae normal.  Neck:     Thyroid: No thyroid mass, thyromegaly or thyroid tenderness.  Breasts:        Right: Normal. No  swelling, mass, nipple discharge, skin change or tenderness.        Left: Normal. No swelling, mass, nipple discharge, skin change or tenderness.  Cardiovascular:     Rate and Rhythm: Normal rate and regular rhythm.     Pulses: Normal pulses.     Heart sounds: Normal heart sounds.  Pulmonary:     Effort: Pulmonary effort is normal.     Breath sounds: Normal breath sounds.  Abdominal:     General: Abdomen is flat.     Palpations: There is no mass.     Tenderness: There is no abdominal tenderness. There is no rebound.   Lymphadenopathy:     Head:     Right side of head: No preauricular or posterior auricular adenopathy.     Left side of head: No preauricular or posterior auricular adenopathy.     Cervical: No cervical adenopathy.     Upper Body:     Right upper body: No supraclavicular or axillary adenopathy.     Left upper body: No supraclavicular or axillary adenopathy.  Skin:    General: Skin is warm and dry.     Findings: No rash.  Neurological:     Mental Status: She is alert and oriented to person, place, and time.       Assessment and Plan:  [name] is a 40 y.o. female presenting to the [facility] for an initial well woman exam/family planning visit.  Contraception counseling: Reviewed all forms of birth control options in the tiered based approach. available including abstinence; over the counter/barrier methods; hormonal contraceptive medication including pill, patch, ring, injection,contraceptive implant, ECP; hormonal and nonhormonal IUDs; permanent sterilization options including vasectomy and the various tubal sterilization modalities. Risks, benefits, and typical effectiveness rates were reviewed.  Questions were answered.  Written information was also given to the patient to review.  Patient desires micronor, this was prescribed for patient. She will follow up in  1 year for surveillance.  She was told to call with any further questions, or with any concerns about this method of contraception.  Emphasized use of condoms 100% of the time for STI prevention.  Emergency Contraception:  N/a  1. Well woman exam -BCM: refill micronor x1 yr. Counseling as above. -Pap: due 12/2024 -CBE: done today. "Active FYIs" info up to date. Recommended screening mammograms beginning at age 12 -STI screening: pt declines all -Hepatitis B/C screening: pt qualifies for screening hep c but declines -Recommended PCP f/u for migraine elevated BP - norethindrone (MICRONOR) 0.35  MG tablet; Take 1 tablet (0.35 mg total) by mouth daily.  Dispense: 28 tablet; Refill: 13     Return in about 1 year (around 01/01/2022) for yearly wellness exam.  No future appointments.  Ann Held, PA-C

## 2021-05-27 ENCOUNTER — Other Ambulatory Visit: Payer: Self-pay | Admitting: Internal Medicine

## 2021-05-27 DIAGNOSIS — F411 Generalized anxiety disorder: Secondary | ICD-10-CM

## 2021-05-27 NOTE — Telephone Encounter (Signed)
   Notes to clinic:Patient has appt on 06/10/2021 Review for refill until that time   Requested Prescriptions  Pending Prescriptions Disp Refills   FLUoxetine (PROZAC) 20 MG capsule [Pharmacy Med Name: FLUoxetine HCl 20 MG Oral Capsule] 90 capsule 0    Sig: Take 1 capsule by mouth once daily      Psychiatry:  Antidepressants - SSRI Failed - 05/27/2021 11:07 AM      Failed - Valid encounter within last 6 months    Recent Outpatient Visits           4 months ago Disorder of right eustachian tube   [facility] Reubin Milan, MD   1 year ago Generalized anxiety disorder   [facility] Reubin Milan, MD   2 years ago Generalized anxiety disorder   [facility] Reubin Milan, MD   2 years ago Generalized anxiety disorder   [facility] Reubin Milan, MD   3 years ago Generalized anxiety disorder   [facility] Reubin Milan, MD       Future Appointments             In 2 weeks Judithann Graves Nyoka Cowden, MD [facility], [facility]

## 2021-06-10 ENCOUNTER — Encounter: Payer: Self-pay | Admitting: Internal Medicine

## 2021-06-10 ENCOUNTER — Ambulatory Visit (INDEPENDENT_AMBULATORY_CARE_PROVIDER_SITE_OTHER): Payer: Self-pay | Admitting: Internal Medicine

## 2021-06-10 ENCOUNTER — Other Ambulatory Visit: Payer: Self-pay

## 2021-06-10 VITALS — BP 118/84 | HR 72 | Temp 98.2°F | Ht 62.0 in | Wt 155.0 lb

## 2021-06-10 DIAGNOSIS — F411 Generalized anxiety disorder: Secondary | ICD-10-CM

## 2021-06-10 MED ORDER — FLUOXETINE HCL 20 MG PO CAPS: 20.0000 mg | ORAL_CAPSULE | Freq: Every day | ORAL | 1 refills | Status: DC

## 2021-06-10 NOTE — Progress Notes (Signed)
Date:  06/10/2021   Name:  Yvette Wise   DOB:  1981/11/11   MRN:  [id number]   Chief Complaint: Anxiety  Anxiety Presents for follow-up visit. Symptoms include excessive worry and irritability. Patient reports no chest pain, compulsions, insomnia, nervous/anxious behavior, obsessions, palpitations, shortness of breath or suicidal ideas. Symptoms occur occasionally. The severity of symptoms is mild.   Compliance with medications is 76-100%.   Lab Results  Component Value Date   CREATININE 0.52 (L) 08/07/2018   BUN 9 08/07/2018   NA 137 08/07/2018   K 4.7 08/07/2018   CL 104 08/07/2018   CO2 20 08/07/2018   No results found for: CHOL, HDL, LDLCALC, LDLDIRECT, TRIG, CHOLHDL Lab Results  Component Value Date   TSH 1.300 08/07/2018   No results found for: HGBA1C Lab Results  Component Value Date   WBC 10.6 08/13/2015   HGB 13.3 08/13/2015   HCT 39.0 08/13/2015   MCV 84.2 08/13/2015   PLT 337 08/13/2015   Lab Results  Component Value Date   ALT 10 08/07/2018   AST 17 08/07/2018   ALKPHOS 79 08/07/2018   BILITOT 0.4 08/07/2018     Review of Systems  Constitutional:  Positive for fatigue and irritability. Negative for chills, fever and unexpected weight change.  Respiratory:  Negative for chest tightness and shortness of breath.   Cardiovascular:  Negative for chest pain and palpitations.  Musculoskeletal:  Negative for arthralgias.  Psychiatric/Behavioral:  Negative for sleep disturbance and suicidal ideas. The patient is not nervous/anxious and does not have insomnia.    Patient Active Problem List   Diagnosis Date Noted   Generalized anxiety disorder 05/03/2016    Allergies  Allergen Reactions   Celexa [Citalopram] Other (See Comments)    Severe nausea, vomiting, diarrhea, chills and fatigue    Past Surgical History:  Procedure Laterality Date   CHOLECYSTECTOMY N/A 06/03/2016   Procedure: LAPAROSCOPIC CHOLECYSTECTOMY;  Surgeon: Leafy Ro, MD;   Location: [facility] ORS;  Service: General;  Laterality: N/A;    Social History   Tobacco Use   Smoking status: Never   Smokeless tobacco: Never  Vaping Use   Vaping Use: Never used  Substance Use Topics   Alcohol use: Not Currently    Alcohol/week: 0.0 standard drinks    Comment: Last ETOH use 6 months ago   Drug use: Never     Medication list has been reviewed and updated.  Current Meds  Medication Sig   FLUoxetine (PROZAC) 20 MG capsule Take 1 capsule by mouth once daily   ibuprofen (ADVIL) 200 MG tablet Take 200 mg by mouth every 6 (six) hours as needed.   norethindrone (MICRONOR) 0.35 MG tablet Take 1 tablet (0.35 mg total) by mouth daily.    PHQ 2/9 Scores 06/10/2021 12/29/2020 05/04/2020 01/30/2019  PHQ - 2 Score 0 0 0 0  PHQ- 9 Score 2 0 0 1    GAD 7 : Generalized Anxiety Score 06/10/2021 12/29/2020 05/04/2020 01/30/2019  Nervous, Anxious, on Edge 0 0 0 0  Control/stop worrying 0 0 0 0  Worry too much - different things 1 0 0 0  Trouble relaxing 0 0 0 0  Restless 0 0 0 0  Easily annoyed or irritable 1 0 0 1  Afraid - awful might happen 0 0 0 0  Total GAD 7 Score 2 0 0 1  Anxiety Difficulty - - Not difficult at all Not difficult at all    BP Readings  from Last 3 Encounters:  06/10/21 118/84  01/01/21 (!) 140/91  12/29/20 124/86    Physical Exam Vitals and nursing note reviewed.  Constitutional:      General: She is not in acute distress.    Appearance: Normal appearance. She is well-developed.  HENT:     Head: Normocephalic and atraumatic.  Cardiovascular:     Rate and Rhythm: Normal rate and regular rhythm.     Pulses: Normal pulses.  Pulmonary:     Effort: Pulmonary effort is normal. No respiratory distress.     Breath sounds: No wheezing or rhonchi.  Musculoskeletal:     Cervical back: Normal range of motion and neck supple.     Right lower leg: No edema.     Left lower leg: No edema.  Lymphadenopathy:     Cervical: No cervical adenopathy.  Skin:     General: Skin is warm and dry.     Findings: No rash.  Neurological:     Mental Status: She is alert and oriented to person, place, and time.  Psychiatric:        Mood and Affect: Mood normal.        Behavior: Behavior normal.    Wt Readings from Last 3 Encounters:  06/10/21 155 lb (70.3 kg)  01/01/21 155 lb (70.3 kg)  12/29/20 158 lb (71.7 kg)    BP 118/84   Pulse 72   Temp 98.2 F (36.8 C) (Oral)   Ht 5\' 2"  (1.575 m)   Wt 155 lb (70.3 kg)   SpO2 99%   BMI 28.35 kg/m   Assessment and Plan: 1. Generalized anxiety disorder Doing well on Prozac. No SI/HI, no change in weight, appetite or sleep Recommend resuming regular exercise  If fatigue continues, return for screening labs - FLUoxetine (PROZAC) 20 MG capsule; Take 1 capsule (20 mg total) by mouth daily.  Dispense: 90 capsule; Refill: 1   Partially dictated using . Any errors are unintentional.  Animal nutritionist, MD [facility]  06/10/2021

## 2021-12-30 ENCOUNTER — Other Ambulatory Visit: Payer: Self-pay | Admitting: Internal Medicine

## 2021-12-30 DIAGNOSIS — F411 Generalized anxiety disorder: Secondary | ICD-10-CM

## 2021-12-30 MED ORDER — FLUOXETINE HCL 20 MG PO CAPS: 20.0000 mg | ORAL_CAPSULE | Freq: Every day | ORAL | 0 refills | Status: DC

## 2021-12-30 NOTE — Telephone Encounter (Signed)
30 day courtesy supply given on 12/30/2021. Has upcoming appt.  Requested Prescriptions  Pending Prescriptions Disp Refills   FLUoxetine (PROZAC) 20 MG capsule 90 capsule 1    Sig: Take 1 capsule (20 mg total) by mouth daily.     Psychiatry:  Antidepressants - SSRI Failed - 12/30/2021 10:27 AM      Failed - Valid encounter within last 6 months    Recent Outpatient Visits          6 months ago Generalized anxiety disorder   [facility] Reubin Milan, MD   1 year ago Disorder of right eustachian tube   [facility] Reubin Milan, MD   1 year ago Generalized anxiety disorder   [facility] Reubin Milan, MD   2 years ago Generalized anxiety disorder   [facility] Reubin Milan, MD   3 years ago Generalized anxiety disorder   [facility] Reubin Milan, MD      Future Appointments            In 1 week Judithann Graves Nyoka Cowden, MD [facility], [facility]

## 2021-12-30 NOTE — Telephone Encounter (Signed)
Medication Refill - Medication:  FLUoxetine (PROZAC) 20 MG capsule    Has the patient contacted their pharmacy? Yes.   Contact PCP  Preferred Pharmacy (with phone number or street name):  [facility] [address] [place] - [address]  [address] [place] [address]  Phone:  [phone]  Fax:  [phone]  Has the patient been seen for an appointment in the last year OR does the patient have an upcoming appointment? Yes.  01/10/22  Agent: Please be advised that RX refills may take up to 3 business days. We ask that you follow-up with your pharmacy.

## 2022-01-10 ENCOUNTER — Ambulatory Visit (INDEPENDENT_AMBULATORY_CARE_PROVIDER_SITE_OTHER): Payer: BC Managed Care – PPO | Admitting: Internal Medicine

## 2022-01-10 ENCOUNTER — Encounter: Payer: Self-pay | Admitting: Internal Medicine

## 2022-01-10 ENCOUNTER — Other Ambulatory Visit: Payer: Self-pay

## 2022-01-10 VITALS — BP 118/80 | HR 98 | Ht 62.0 in | Wt 161.0 lb

## 2022-01-10 DIAGNOSIS — F411 Generalized anxiety disorder: Secondary | ICD-10-CM

## 2022-01-10 DIAGNOSIS — L2084 Intrinsic (allergic) eczema: Secondary | ICD-10-CM | POA: Diagnosis not present

## 2022-01-10 MED ORDER — FLUOXETINE HCL 20 MG PO CAPS: 20.0000 mg | ORAL_CAPSULE | Freq: Every day | ORAL | 1 refills | Status: DC

## 2022-01-10 MED ORDER — CLOTRIMAZOLE-BETAMETHASONE 1-0.05 % EX CREA: 1.0000 "application " | TOPICAL_CREAM | Freq: Two times a day (BID) | CUTANEOUS | 1 refills | Status: DC

## 2022-01-10 NOTE — Progress Notes (Signed)
Date:  01/10/2022   Name:  Yvette Wise   DOB:  January 12, 1981   MRN:  [id number]   Chief Complaint: Anxiety  Anxiety Presents for follow-up visit. Symptoms include irritability. Patient reports no compulsions, excessive worry, nausea, nervous/anxious behavior, palpitations, shortness of breath or suicidal ideas. Symptoms occur rarely. The quality of sleep is good.    Rash This is a chronic problem. The problem has been gradually improving since onset. The affected locations include the left hand and right hand. The rash is characterized by itchiness, redness and peeling. She was exposed to nothing. Pertinent negatives include no diarrhea, fatigue, fever or shortness of breath. Past treatments include anti-itch cream. The treatment provided mild (left hand is improving but right is not) relief.   Lab Results  Component Value Date   NA 137 08/07/2018   K 4.7 08/07/2018   CO2 20 08/07/2018   GLUCOSE 82 08/07/2018   BUN 9 08/07/2018   CREATININE 0.52 (L) 08/07/2018   CALCIUM 9.7 08/07/2018   GFRNONAA 124 08/07/2018   No results found for: CHOL, HDL, LDLCALC, LDLDIRECT, TRIG, CHOLHDL Lab Results  Component Value Date   TSH 1.300 08/07/2018   No results found for: HGBA1C Lab Results  Component Value Date   WBC 10.6 08/13/2015   HGB 13.3 08/13/2015   HCT 39.0 08/13/2015   MCV 84.2 08/13/2015   PLT 337 08/13/2015   Lab Results  Component Value Date   ALT 10 08/07/2018   AST 17 08/07/2018   ALKPHOS 79 08/07/2018   BILITOT 0.4 08/07/2018   No results found for: 25OHVITD2, 25OHVITD3, VD25OH   Review of Systems  Constitutional:  Positive for irritability. Negative for chills, fatigue, fever and unexpected weight change.  Respiratory:  Negative for chest tightness, shortness of breath and wheezing.   Cardiovascular:  Negative for palpitations.  Gastrointestinal:  Negative for abdominal pain, diarrhea and nausea.  Musculoskeletal:  Negative for arthralgias.  Skin:   Positive for rash.  Psychiatric/Behavioral:  Negative for dysphoric mood and suicidal ideas. The patient is not nervous/anxious.    Patient Active Problem List   Diagnosis Date Noted   Generalized anxiety disorder 05/03/2016    Allergies  Allergen Reactions   Celexa [Citalopram] Other (See Comments)    Severe nausea, vomiting, diarrhea, chills and fatigue    Past Surgical History:  Procedure Laterality Date   CHOLECYSTECTOMY N/A 06/03/2016   Procedure: LAPAROSCOPIC CHOLECYSTECTOMY;  Surgeon: Leafy Ro, MD;  Location: [facility] ORS;  Service: General;  Laterality: N/A;    Social History   Tobacco Use   Smoking status: Never   Smokeless tobacco: Never  Vaping Use   Vaping Use: Never used  Substance Use Topics   Alcohol use: Not Currently    Alcohol/week: 0.0 standard drinks    Comment: Last ETOH use 6 months ago   Drug use: Never     Medication list has been reviewed and updated.  Current Meds  Medication Sig   FLUoxetine (PROZAC) 20 MG capsule Take 1 capsule (20 mg total) by mouth daily.   ibuprofen (ADVIL) 200 MG tablet Take 200 mg by mouth every 6 (six) hours as needed.   norethindrone (MICRONOR) 0.35 MG tablet Take 1 tablet (0.35 mg total) by mouth daily.    PHQ 2/9 Scores 01/10/2022 06/10/2021 12/29/2020 05/04/2020  PHQ - 2 Score 0 0 0 0  PHQ- 9 Score 3 2 0 0    GAD 7 : Generalized Anxiety Score 01/10/2022 06/10/2021 12/29/2020 05/04/2020  Nervous, Anxious, on Edge 0 0 0 0  Control/stop worrying 0 0 0 0  Worry too much - different things 0 1 0 0  Trouble relaxing 0 0 0 0  Restless 0 0 0 0  Easily annoyed or irritable 1 1 0 0  Afraid - awful might happen 0 0 0 0  Total GAD 7 Score 1 2 0 0  Anxiety Difficulty Not difficult at all - - Not difficult at all    BP Readings from Last 3 Encounters:  01/10/22 118/80  06/10/21 118/84  01/01/21 (!) 140/91    Physical Exam Constitutional:      Appearance: Normal appearance.  Cardiovascular:     Rate and Rhythm:  Normal rate and regular rhythm.     Pulses: Normal pulses.  Pulmonary:     Effort: Pulmonary effort is normal.     Breath sounds: No wheezing or rhonchi.  Musculoskeletal:     Cervical back: Normal range of motion.  Lymphadenopathy:     Cervical: No cervical adenopathy.  Skin:    General: Skin is warm and dry.     Capillary Refill: Capillary refill takes less than 2 seconds.     Findings: No rash (eczematous patchy rash on dorsum of right hand).  Neurological:     Mental Status: She is alert.    Wt Readings from Last 3 Encounters:  01/10/22 161 lb (73 kg)  06/10/21 155 lb (70.3 kg)  01/01/21 155 lb (70.3 kg)    BP 118/80    Pulse 98    Ht 5\' 2"  (1.575 m)    Wt 161 lb (73 kg)    LMP 12/28/2021    SpO2 99%    BMI 29.45 kg/m   Assessment and Plan: 1. Generalized anxiety disorder Symptoms are very well controlled on Prozac. No side effects, SI/HI.  Will continue current therapy. - FLUoxetine (PROZAC) 20 MG capsule; Take 1 capsule (20 mg total) by mouth daily.  Dispense: 90 capsule; Refill: 1  2. Intrinsic eczema Worsening by repeated hand washing and latex gloves required by work at 02/25/2022. Will prescribe prescription strength cream bid - esp after work and at bedtime. - clotrimazole-betamethasone (LOTRISONE) cream; Apply 1 application topically 2 (two) times daily.  Dispense: 45 g; Refill: 1   Partially dictated using [employer]. Any errors are unintentional.  Animal nutritionist, MD [facility]  01/10/2022

## 2022-01-17 ENCOUNTER — Ambulatory Visit (LOCAL_COMMUNITY_HEALTH_CENTER): Payer: BC Managed Care – PPO | Admitting: Family Medicine

## 2022-01-17 ENCOUNTER — Other Ambulatory Visit: Payer: Self-pay

## 2022-01-17 ENCOUNTER — Encounter: Payer: Self-pay | Admitting: Family Medicine

## 2022-01-17 VITALS — BP 130/86 | Ht 63.0 in | Wt 160.4 lb

## 2022-01-17 DIAGNOSIS — Z3041 Encounter for surveillance of contraceptive pills: Secondary | ICD-10-CM | POA: Diagnosis not present

## 2022-01-17 DIAGNOSIS — Z0000 Encounter for general adult medical examination without abnormal findings: Secondary | ICD-10-CM

## 2022-01-17 DIAGNOSIS — Z3009 Encounter for other general counseling and advice on contraception: Secondary | ICD-10-CM | POA: Diagnosis not present

## 2022-01-17 MED ORDER — NORETHINDRONE 0.35 MG PO TABS
1.0000 | ORAL_TABLET | Freq: Every day | ORAL | 12 refills | Status: DC
Start: 2022-01-17 — End: 2023-01-18

## 2022-01-17 NOTE — Progress Notes (Signed)
[facility] DEPARTMENT [facility] [address] Main Number: [phone]  Family Planning Visit- Repeat Yearly Visit  Subjective:  Yvette Wise is a 41 y.o. G2P2002  being seen today for an annual wellness visit and to discuss contraception options.   The patient is currently using Oral Contraceptive for pregnancy prevention. Patient does not want a pregnancy in the next year. Patient has the following medical problems: has Generalized anxiety disorder on their problem list.  Chief Complaint  Patient presents with   Annual Exam   Contraception    Patient reports here for physical and OCP refill.    Patient denies any problems or concerns    See flowsheet for other program required questions.   Body mass index is 28.41 kg/m. - Patient is eligible for diabetes screening based on BMI and age >19?  no HA1C ordered? no  Patient reports 1 of partners in last year. Desires STI screening?  No - declined    Has patient been screened once for HCV in the past?  No  No results found for: HCVAB  Does the patient have current of drug use, have a partner with drug use, and/or has been incarcerated since last result? No  If yes-- Screen for HCV through [facility]   Does the patient meet criteria for HBV testing? Yes  Criteria:  -Household, sexual or needle sharing contact with HBV -History of drug use -HIV positive -Those with known Hep C   Health Maintenance Due  Topic Date Due   Hepatitis C Screening  Never done    Review of Systems  Constitutional:  Negative for chills, fever, malaise/fatigue and weight loss.  HENT:  Negative for congestion, hearing loss and sore throat.   Eyes:  Negative for blurred vision, double vision and photophobia.  Respiratory:  Negative for shortness of breath.   Cardiovascular:  Negative for chest pain.  Gastrointestinal:  Negative for abdominal pain, blood in stool, constipation, diarrhea, heartburn,  nausea and vomiting.  Genitourinary:  Negative for dysuria and frequency.  Musculoskeletal:  Negative for back pain, joint pain and neck pain.  Skin:  Negative for itching and rash.  Neurological:  Positive for dizziness. Negative for weakness and headaches.       Reports getting head rush when standing too quickly   Endo/Heme/Allergies:  Does not bruise/bleed easily.  Psychiatric/Behavioral:  Negative for depression, substance abuse and suicidal ideas.    The following portions of the patient's history were reviewed and updated as appropriate: allergies, current medications, past family history, past medical history, past social history, past surgical history and problem list. Problem list updated.  Objective:   Vitals:   01/17/22 1343  BP: 130/86  Weight: 160 lb 6.4 oz (72.8 kg)  Height: 5\' 3"  (1.6 m)    Physical Exam Vitals and nursing note reviewed.  Constitutional:      Appearance: Normal appearance.  HENT:     Head: Normocephalic and atraumatic.     Mouth/Throat:     Mouth: Mucous membranes are moist.     Pharynx: No oropharyngeal exudate or posterior oropharyngeal erythema.  Eyes:     General: No scleral icterus. Cardiovascular:     Rate and Rhythm: Normal rate and regular rhythm.     Pulses: Normal pulses.     Heart sounds: Normal heart sounds.  Pulmonary:     Effort: Pulmonary effort is normal.     Breath sounds: Normal breath sounds.  Abdominal:  General: Abdomen is flat. Bowel sounds are normal.     Palpations: Abdomen is soft.  Genitourinary:    Comments: Deferred  Musculoskeletal:        General: Normal range of motion.     Cervical back: Normal range of motion and neck supple.  Skin:    General: Skin is warm and dry.  Neurological:     General: No focal deficit present.     Mental Status: She is alert and oriented to person, place, and time.  Psychiatric:        Behavior: Behavior normal.      Assessment and Plan:  Yvette Wise is a 41  y.o. female G2P2002 presenting to the [facility] Department for an yearly wellness and contraception visit  Contraception counseling: Reviewed all forms of birth control options in the tiered based approach. available including abstinence; over the counter/barrier methods; hormonal contraceptive medication including pill, patch, ring, injection,contraceptive implant, ECP; hormonal and nonhormonal IUDs; permanent sterilization options including vasectomy and the various tubal sterilization modalities. Risks, benefits, and typical effectiveness rates were reviewed.  Questions were answered.  Written information was also given to the patient to review.  Patient desires to continue Micronor, this was prescribed for patient. She will follow up as needed  for surveillance.    Patient was told to call with any further questions, or with any concerns about this method of contraception.  Emphasized use of condoms 100% of the time for STI prevention.  Patient was not offered ECP based on last sex and BCM    1. Routine general medical examination at a health care facility Well woman exam  Pap due 2026 CBE due 2025  2. Encounter for surveillance of contraceptive pills Pt desires to continue with OCP .  RX sent to listed Pharmacy.   - norethindrone (MICRONOR) 0.35 MG tablet; Take 1 tablet (0.35 mg total) by mouth daily.  Dispense: 28 tablet; Refill: 12     Return in about 1 year (around 01/17/2023) for annual well woman exam.  No future appointments.  Wendi Snipes, FNP

## 2022-01-17 NOTE — Progress Notes (Signed)
Pt here for PE and BC.  Consent form signed and OCP sent to pt's Pharmacy.  Windle Guard, RN

## 2022-06-20 ENCOUNTER — Other Ambulatory Visit: Payer: Self-pay | Admitting: Internal Medicine

## 2022-06-20 DIAGNOSIS — L2084 Intrinsic (allergic) eczema: Secondary | ICD-10-CM

## 2022-06-20 NOTE — Telephone Encounter (Signed)
Medication Refill - Medication: Pt called asking for a refill on a cream that Dr. Judithann Graves gave her.  She does not know the name.  It is for execma.  It is a cream.   Has the patient contacted their pharmacy? No  does not know the name of the rx  Preferred Pharmacy (with phone number or street name): [facility] [place] Has the patient been seen for an appointment in the last year OR does the patient have an upcoming appointment? Yes.    Agent: Please be advised that RX refills may take up to 3 business days. We ask that you follow-up with your pharmacy.

## 2022-06-22 MED ORDER — CLOTRIMAZOLE-BETAMETHASONE 1-0.05 % EX CREA: 1.0000 | TOPICAL_CREAM | Freq: Two times a day (BID) | CUTANEOUS | 1 refills | Status: DC

## 2022-06-22 NOTE — Telephone Encounter (Signed)
Requested medication (s) are due for refill today: Yes  Requested medication (s) are on the active medication list: Yes  Last refill:  01/10/22  Future visit scheduled: No  Notes to clinic:  No protocol.    Requested Prescriptions  Pending Prescriptions Disp Refills   clotrimazole-betamethasone (LOTRISONE) cream 45 g 1    Sig: Apply 1 Application topically 2 (two) times daily.     Off-Protocol Failed - 06/20/2022 12:52 PM      Failed - Medication not assigned to a protocol, review manually.      Passed - Valid encounter within last 12 months    Recent Outpatient Visits           5 months ago Intrinsic eczema   [facility] Reubin Milan, MD   1 year ago Generalized anxiety disorder   [facility] Reubin Milan, MD   1 year ago Disorder of right eustachian tube   [facility] Reubin Milan, MD   2 years ago Generalized anxiety disorder   [facility] Reubin Milan, MD   3 years ago Generalized anxiety disorder   [facility] Reubin Milan, MD

## 2022-06-22 NOTE — Telephone Encounter (Signed)
Ok to refill 

## 2022-06-23 ENCOUNTER — Telehealth: Payer: Self-pay | Admitting: Internal Medicine

## 2022-06-23 ENCOUNTER — Other Ambulatory Visit: Payer: Self-pay

## 2022-06-23 NOTE — Telephone Encounter (Signed)
Copied from CRM [id number]. Topic: General - Other >> Jun 23, 2022  8:48 AM Yvette Wise wrote: Reason for CRM: The patient has called for an update on their previous request for clotrimazole-betamethasone (LOTRISONE) cream  Please contact the patient further when possible

## 2022-06-23 NOTE — Telephone Encounter (Signed)
Pt informed of refill sent.  - Shaquira Moroz

## 2022-08-23 ENCOUNTER — Encounter: Payer: Self-pay | Admitting: Internal Medicine

## 2022-08-23 ENCOUNTER — Ambulatory Visit (INDEPENDENT_AMBULATORY_CARE_PROVIDER_SITE_OTHER): Payer: BC Managed Care – PPO | Admitting: Internal Medicine

## 2022-08-23 VITALS — BP 122/86 | HR 90 | Ht 63.0 in | Wt 159.0 lb

## 2022-08-23 DIAGNOSIS — F411 Generalized anxiety disorder: Secondary | ICD-10-CM

## 2022-08-23 DIAGNOSIS — L309 Dermatitis, unspecified: Secondary | ICD-10-CM | POA: Diagnosis not present

## 2022-08-23 MED ORDER — FLUOCINONIDE 0.05 % EX OINT: 1.0000 | TOPICAL_OINTMENT | Freq: Two times a day (BID) | CUTANEOUS | 2 refills | Status: DC

## 2022-08-23 MED ORDER — FLUOXETINE HCL 20 MG PO CAPS: 20.0000 mg | ORAL_CAPSULE | Freq: Every day | ORAL | 1 refills | Status: DC

## 2022-08-23 NOTE — Progress Notes (Signed)
Date:  08/23/2022   Name:  Yvette Wise   DOB:  05-26-1981   MRN:  [id number]   Chief Complaint: Anxiety (Excema/) and Eczema  Anxiety Presents for follow-up visit. Symptoms include irritability. Patient reports no chest pain, dizziness, nervous/anxious behavior or shortness of breath. Symptoms occur occasionally. The quality of sleep is good. Nighttime awakenings: none.   Compliance with medications is 76-100% (prozac).  Rash This is a recurrent problem. Episode onset: 8 months. The problem has been gradually worsening since onset. The affected locations include the left hand, left fingers, right hand, right fingers, right wrist and left wrist. The rash is characterized by bruising, dryness, itchiness, peeling, scaling, redness, pain and swelling. Pertinent negatives include no fatigue or shortness of breath. Past treatments include anti-itch cream, moisturizer and antibiotic cream. The treatment provided mild relief.    Lab Results  Component Value Date   NA 137 08/07/2018   K 4.7 08/07/2018   CO2 20 08/07/2018   GLUCOSE 82 08/07/2018   BUN 9 08/07/2018   CREATININE 0.52 (L) 08/07/2018   CALCIUM 9.7 08/07/2018   GFRNONAA 124 08/07/2018   No results found for: "CHOL", "HDL", "LDLCALC", "LDLDIRECT", "TRIG", "CHOLHDL" Lab Results  Component Value Date   TSH 1.300 08/07/2018   No results found for: "HGBA1C" Lab Results  Component Value Date   WBC 10.6 08/13/2015   HGB 13.3 08/13/2015   HCT 39.0 08/13/2015   MCV 84.2 08/13/2015   PLT 337 08/13/2015   Lab Results  Component Value Date   ALT 10 08/07/2018   AST 17 08/07/2018   ALKPHOS 79 08/07/2018   BILITOT 0.4 08/07/2018   No results found for: "25OHVITD2", "25OHVITD3", "VD25OH"   Review of Systems  Constitutional:  Positive for irritability. Negative for appetite change, chills, fatigue and unexpected weight change.  HENT:  Negative for trouble swallowing.   Respiratory:  Negative for chest tightness and  shortness of breath.   Cardiovascular:  Negative for chest pain.  Skin:  Positive for rash.  Neurological:  Negative for dizziness and headaches.  Psychiatric/Behavioral:  Negative for dysphoric mood and sleep disturbance. The patient is not nervous/anxious.     Patient Active Problem List   Diagnosis Date Noted   Generalized anxiety disorder 05/03/2016    Allergies  Allergen Reactions   Celexa [Citalopram] Other (See Comments)    Severe nausea, vomiting, diarrhea, chills and fatigue    Past Surgical History:  Procedure Laterality Date   CHOLECYSTECTOMY N/A 06/03/2016   Procedure: LAPAROSCOPIC CHOLECYSTECTOMY;  Surgeon: Leafy Ro, MD;  Location: [facility] ORS;  Service: General;  Laterality: N/A;    Social History   Tobacco Use   Smoking status: Never   Smokeless tobacco: Never  Vaping Use   Vaping Use: Never used  Substance Use Topics   Alcohol use: Not Currently   Drug use: Not Currently    Types: Marijuana     Medication list has been reviewed and updated.  Current Meds  Medication Sig   fluocinonide ointment (LIDEX) 0.05 % Apply 1 Application topically 2 (two) times daily. To rash on hands   ibuprofen (ADVIL) 200 MG tablet Take 200 mg by mouth every 6 (six) hours as needed.   norethindrone (MICRONOR) 0.35 MG tablet Take 1 tablet (0.35 mg total) by mouth daily.   [DISCONTINUED] FLUoxetine (PROZAC) 20 MG capsule Take 1 capsule (20 mg total) by mouth daily.       08/23/2022    2:17 PM 01/10/2022  3:33 PM 06/10/2021    9:23 AM 12/29/2020    3:33 PM  GAD 7 : Generalized Anxiety Score  Nervous, Anxious, on Edge 1 0 0 0  Control/stop worrying 1 0 0 0  Worry too much - different things 1 0 1 0  Trouble relaxing 0 0 0 0  Restless 0 0 0 0  Easily annoyed or irritable 2 1 1  0  Afraid - awful might happen 0 0 0 0  Total GAD 7 Score 5 1 2  0  Anxiety Difficulty Somewhat difficult Not difficult at all         08/23/2022    2:16 PM 01/17/2022    1:50 PM 01/10/2022     3:33 PM  Depression screen PHQ 2/9  Decreased Interest 1 0 0  Down, Depressed, Hopeless 1 0 0  PHQ - 2 Score 2 0 0  Altered sleeping 0  0  Tired, decreased energy 3  2  Change in appetite 0  0  Feeling bad or failure about yourself  0  0  Trouble concentrating 0  1  Moving slowly or fidgety/restless 0  0  Suicidal thoughts 0  0  PHQ-9 Score 5  3  Difficult doing work/chores Not difficult at all  Not difficult at all    BP Readings from Last 3 Encounters:  08/23/22 122/86  01/17/22 130/86  01/10/22 118/80    Physical Exam Vitals and nursing note reviewed.  Constitutional:      General: She is not in acute distress.    Appearance: She is well-developed.  HENT:     Head: Normocephalic and atraumatic.  Cardiovascular:     Rate and Rhythm: Normal rate and regular rhythm.     Pulses: Normal pulses.     Heart sounds: No murmur heard. Pulmonary:     Effort: Pulmonary effort is normal. No respiratory distress.     Breath sounds: No wheezing or rhonchi.  Musculoskeletal:     Cervical back: Normal range of motion.     Right lower leg: No edema.     Left lower leg: No edema.  Lymphadenopathy:     Cervical: No cervical adenopathy.  Skin:    General: Skin is warm and dry.     Findings: No rash.     Comments: Eczematous rash to side of both hands with peeling and cracking of skin; lesions also on side of 5th fingers  Neurological:     Mental Status: She is alert and oriented to person, place, and time.  Psychiatric:        Mood and Affect: Mood normal.        Behavior: Behavior normal.     Wt Readings from Last 3 Encounters:  08/23/22 159 lb (72.1 kg)  01/17/22 160 lb 6.4 oz (72.8 kg)  01/10/22 161 lb (73 kg)    BP 122/86   Pulse 90   Ht 5\' 3"  (1.6 m)   Wt 159 lb (72.1 kg)   SpO2 96%   BMI 28.17 kg/m   Assessment and Plan: 1. Generalized anxiety disorder Doing well on Prozac 20 mg No side effects or weight gain noted - FLUoxetine (PROZAC) 20 MG capsule; Take 1  capsule (20 mg total) by mouth daily.  Dispense: 90 capsule; Refill: 1  2. Eczema of hand Responded initially to dexamethazone but now worse again Will try higher potency steroid applied bid with cotton gloves at bedtime May need Dermatology referral if no improvement - fluocinonide ointment (LIDEX)  0.05 %; Apply 1 Application topically 2 (two) times daily. To rash on hands  Dispense: 30 g; Refill: 2   Partially dictated using Animal nutritionist. Any errors are unintentional.  Bari Edward, MD [facility]  08/23/2022

## 2022-09-26 ENCOUNTER — Other Ambulatory Visit: Payer: Self-pay

## 2022-09-26 ENCOUNTER — Telehealth: Payer: Self-pay | Admitting: Internal Medicine

## 2022-09-26 DIAGNOSIS — L308 Other specified dermatitis: Secondary | ICD-10-CM

## 2022-09-26 NOTE — Telephone Encounter (Signed)
Copied from [place] [id number]. Topic: Referral - Question >> Sep 26, 2022  8:35 AM Penni Bombard wrote: Reason for CRM: pt called asking for a referral to dermatology.  Dr. Army Melia told her that she would send he if her eczema was not getting better.  CB@  [phone]

## 2022-09-26 NOTE — Telephone Encounter (Signed)
Referral placed. Sherrelle Prochazka  

## 2022-12-08 DIAGNOSIS — L298 Other pruritus: Secondary | ICD-10-CM | POA: Diagnosis not present

## 2022-12-08 DIAGNOSIS — L2089 Other atopic dermatitis: Secondary | ICD-10-CM | POA: Diagnosis not present

## 2023-01-17 ENCOUNTER — Ambulatory Visit: Payer: Self-pay

## 2023-01-17 ENCOUNTER — Telehealth: Payer: Self-pay | Admitting: Family Medicine

## 2023-01-17 NOTE — Telephone Encounter (Signed)
Was asked to reschedule patients PE/BC appt. She ran out of [facility] on Sat.  Charlestine Night said she talked to Almyra Free about calling in a refill for the patient. Her new rescheduled appt if 2/14. Can someone call her about getting [facility]

## 2023-01-18 ENCOUNTER — Ambulatory Visit (LOCAL_COMMUNITY_HEALTH_CENTER): Payer: Self-pay | Admitting: Family

## 2023-01-18 VITALS — BP 130/84 | Ht 62.0 in | Wt 162.0 lb

## 2023-01-18 DIAGNOSIS — Z3041 Encounter for surveillance of contraceptive pills: Secondary | ICD-10-CM

## 2023-01-18 DIAGNOSIS — Z0000 Encounter for general adult medical examination without abnormal findings: Secondary | ICD-10-CM

## 2023-01-18 DIAGNOSIS — Z309 Encounter for contraceptive management, unspecified: Secondary | ICD-10-CM

## 2023-01-18 DIAGNOSIS — E663 Overweight: Secondary | ICD-10-CM

## 2023-01-18 MED ORDER — NORETHINDRONE 0.35 MG PO TABS
1.0000 | ORAL_TABLET | Freq: Every day | ORAL | 12 refills | Status: DC
Start: 2023-01-18 — End: 2023-01-18

## 2023-01-18 MED ORDER — NORETHINDRONE 0.35 MG PO TABS: 1.0000 | ORAL_TABLET | Freq: Every day | ORAL | 12 refills | Status: DC

## 2023-01-18 NOTE — Progress Notes (Signed)
[facility] Number: [phone]  Family Planning Visit- Repeat Yearly Visit  Subjective:  Yvette Wise is a 42 y.o. G2P2002  being seen today for an annual wellness visit and to discuss contraception options.   The patient is currently using Oral Contraceptive for pregnancy prevention. Patient does not want a pregnancy in the next year.   Taking Micronor because COCs increase her blood pressure.   report they are looking for a method that provides Cycle control   Patient has the following medical problems: has Generalized anxiety disorder on their problem list.  Chief Complaint  Patient presents with   Contraception    PE and ocp     Patient reports took last Micronor pill on 01/14/23, period started on 01/17/23, last sex on 01/13/23.  See flowsheet for other program required questions.   Body mass index is 29.63 kg/m. - Patient is eligible for diabetes screening based on BMI> 25 and age >35?  yes HA1C ordered? No, patient not interested today  Patient reports 1 of partners in last year. Desires STI screening?  no   Has patient been screened once for HCV in the past?  No  No results found for: "HCVAB"  Does the patient have current of drug use, have a partner with drug use, and/or has been incarcerated since last result? no If yes-- Screen for HCV through [facility]   Does the patient meet criteria for HBV testing? No  Criteria:  -Household, sexual or needle sharing contact with HBV -History of drug use -HIV positive -Those with known Hep C   Health Maintenance Due  Topic Date Due   COVID-19 Vaccine (1) Never done   Hepatitis C Screening  Never done    Review of Systems  All other systems reviewed and are negative.   The following portions of the patient's history were reviewed and updated as appropriate: allergies, current medications, past family history, past medical  history, past social history, past surgical history and problem list. Problem list updated.  Objective:   Vitals:   01/18/23 1515  BP: 130/84  Weight: 162 lb (73.5 kg)  Height: 5\' 2"  (1.575 m)  LMP: 01/17/2023  Physical Exam Vitals and nursing note reviewed.  Constitutional:      Appearance: Normal appearance.  Cardiovascular:     Rate and Rhythm: Normal rate and regular rhythm.     Pulses: Normal pulses.     Heart sounds: Normal heart sounds.  Pulmonary:     Effort: Pulmonary effort is normal.     Breath sounds: Normal breath sounds.  Genitourinary:    Comments: Genital exam deferred- no concerns today Skin:    General: Skin is warm and dry.  Neurological:     Mental Status: She is alert and oriented to person, place, and time.       Assessment and Plan:  Yvette Wise is a 42 y.o. female G2P2002 presenting to the [facility] Department for an yearly wellness and contraception visit   Contraception counseling: Reviewed options based on patient desire and reproductive life plan. Patient is interested in Oral Contraceptive. This was provided to the patient today.   Risks, benefits, and typical effectiveness rates were reviewed.  Questions were answered.  Written information was also given to the patient to review/.    The patient will follow up in  1 years for surveillance.  The patient was told to call with any  further questions, or with any concerns about this method of contraception.  Emphasized use of condoms 100% of the time for STI prevention.  Patient was assessed for need for ECP. Patient was not offered ECP based on > 120 hours .    1. Well woman exam (no gynecological exam) PE done today, without breast or pelvic exams CBE due in 2025 Pap due in 12/2024  2. Encounter for surveillance of contraceptive pills Micronor- 1 PO qd x 28 days, 1 pack with 12 refills Restart pills today, use condoms for next 7 days as backup contraception.   3.  Overweight (BMI 25.0-29.9) Weight loss through diet and exercise Prefers to F/U with PCP for routine labwork.    Return in about 1 year (around 01/19/2024) for Yearly physical.  No future appointments.  Marline Backbone, FNP

## 2023-01-18 NOTE — Progress Notes (Signed)
2

## 2023-01-18 NOTE — Progress Notes (Signed)
Pt appointment for PE and OCP prescription. Seen by [facility]. Family planning packet given and contents reviewed.

## 2023-02-01 ENCOUNTER — Ambulatory Visit: Payer: Self-pay

## 2023-08-04 ENCOUNTER — Ambulatory Visit (INDEPENDENT_AMBULATORY_CARE_PROVIDER_SITE_OTHER): Payer: No Typology Code available for payment source | Admitting: Internal Medicine

## 2023-08-04 ENCOUNTER — Encounter: Payer: Self-pay | Admitting: Internal Medicine

## 2023-08-04 DIAGNOSIS — F411 Generalized anxiety disorder: Secondary | ICD-10-CM | POA: Diagnosis not present

## 2023-08-04 MED ORDER — FLUOXETINE HCL 20 MG PO CAPS: 20.0000 mg | ORAL_CAPSULE | Freq: Every day | ORAL | 0 refills | Status: DC

## 2023-08-04 MED ORDER — HYDROXYZINE PAMOATE 25 MG PO CAPS: 25.0000 mg | ORAL_CAPSULE | Freq: Three times a day (TID) | ORAL | 0 refills | Status: DC | PRN

## 2023-08-04 NOTE — Assessment & Plan Note (Signed)
Recurrent symptoms since stopping Prozac Will resume treatment, add Vistaril prn and follow up in one month.

## 2023-08-04 NOTE — Progress Notes (Signed)
Date:  08/04/2023   Name:  Yvette Wise   DOB:  1981/12/08   MRN:  [id number]   Chief Complaint: Depression  Depression        This is a recurrent problem.  The problem occurs daily.  The problem has been gradually worsening since onset.  Associated symptoms include decreased concentration, fatigue, irritable, restlessness and decreased interest.  Associated symptoms include no suicidal ideas.  Past treatments include SSRIs - Selective serotonin reuptake inhibitors (stopped Prozac 2 months ago bec she was doing well).   Lab Results  Component Value Date   NA 137 08/07/2018   K 4.7 08/07/2018   CO2 20 08/07/2018   GLUCOSE 82 08/07/2018   BUN 9 08/07/2018   CREATININE 0.52 (L) 08/07/2018   CALCIUM 9.7 08/07/2018   GFRNONAA 124 08/07/2018   No results found for: "CHOL", "HDL", "LDLCALC", "LDLDIRECT", "TRIG", "CHOLHDL" Lab Results  Component Value Date   TSH 1.300 08/07/2018   No results found for: "HGBA1C" Lab Results  Component Value Date   WBC 10.6 08/13/2015   HGB 13.3 08/13/2015   HCT 39.0 08/13/2015   MCV 84.2 08/13/2015   PLT 337 08/13/2015   Lab Results  Component Value Date   ALT 10 08/07/2018   AST 17 08/07/2018   ALKPHOS 79 08/07/2018   BILITOT 0.4 08/07/2018   No results found for: "25OHVITD2", "25OHVITD3", "VD25OH"   Review of Systems  Constitutional:  Positive for fatigue.  Respiratory:  Negative for chest tightness and shortness of breath.   Cardiovascular:  Negative for chest pain.  Psychiatric/Behavioral:  Positive for decreased concentration and depression. Negative for suicidal ideas. The patient is nervous/anxious.     Patient Active Problem List   Diagnosis Date Noted   Generalized anxiety disorder 05/03/2016    Allergies  Allergen Reactions   Celexa [Citalopram] Other (See Comments)    Severe nausea, vomiting, diarrhea, chills and fatigue    Past Surgical History:  Procedure Laterality Date   CHOLECYSTECTOMY N/A 06/03/2016    Procedure: LAPAROSCOPIC CHOLECYSTECTOMY;  Surgeon: Leafy Ro, MD;  Location: [facility];  Service: General;  Laterality: N/A;    Social History   Tobacco Use   Smoking status: Never    Passive exposure: Current   Smokeless tobacco: Never   Tobacco comments:    Exposed to smoke by friends.   Vaping Use   Vaping status: Never Used  Substance Use Topics   Alcohol use: Not Currently   Drug use: Not Currently    Types: Marijuana     Medication list has been reviewed and updated.  Current Meds  Medication Sig   fluocinonide ointment (LIDEX) 0.05 % Apply 1 Application topically 2 (two) times daily. To rash on hands   hydrOXYzine (VISTARIL) 25 MG capsule Take 1 capsule (25 mg total) by mouth every 8 (eight) hours as needed for anxiety.   ibuprofen (ADVIL) 200 MG tablet Take 200 mg by mouth every 6 (six) hours as needed.   norethindrone (MICRONOR) 0.35 MG tablet Take 1 tablet (0.35 mg total) by mouth daily.       08/04/2023   10:30 AM 08/23/2022    2:17 PM 01/10/2022    3:33 PM 06/10/2021    9:23 AM  GAD 7 : Generalized Anxiety Score  Nervous, Anxious, on Edge 3 1 0 0  Control/stop worrying 3 1 0 0  Worry too much - different things 3 1 0 1  Trouble relaxing 3 0 0 0  Restless 2 0 0 0  Easily annoyed or irritable 2 2 1 1   Afraid - awful might happen 3 0 0 0  Total GAD 7 Score 19 5 1 2   Anxiety Difficulty Extremely difficult Somewhat difficult Not difficult at all        08/04/2023   10:30 AM 01/18/2023    3:28 PM 08/23/2022    2:16 PM  Depression screen PHQ 2/9  Decreased Interest 1 0 1  Down, Depressed, Hopeless 3 0 1  PHQ - 2 Score 4 0 2  Altered sleeping 3  0  Tired, decreased energy 2  3  Change in appetite 3  0  Feeling bad or failure about yourself  3  0  Trouble concentrating 3  0  Moving slowly or fidgety/restless 1  0  Suicidal thoughts 0  0  PHQ-9 Score 19  5  Difficult doing work/chores Extremely dIfficult  Not difficult at all    BP Readings from Last 3  Encounters:  08/04/23 132/78  01/18/23 130/84  08/23/22 122/86    Physical Exam Vitals and nursing note reviewed.  Constitutional:      General: She is irritable. She is not in acute distress.    Appearance: She is well-developed.  HENT:     Head: Normocephalic and atraumatic.  Cardiovascular:     Rate and Rhythm: Normal rate and regular rhythm.  Pulmonary:     Effort: Pulmonary effort is normal. No respiratory distress.     Breath sounds: No wheezing or rhonchi.  Skin:    General: Skin is warm and dry.     Findings: No rash.  Neurological:     Mental Status: She is alert and oriented to person, place, and time.  Psychiatric:        Attention and Perception: Attention normal.        Mood and Affect: Mood is anxious. Affect is tearful.        Behavior: Behavior normal.        Thought Content: Thought content does not include suicidal ideation. Thought content does not include suicidal plan.     Wt Readings from Last 3 Encounters:  08/04/23 149 lb 6.4 oz (67.8 kg)  01/18/23 162 lb (73.5 kg)  08/23/22 159 lb (72.1 kg)    BP 132/78   Pulse 93   Ht 5\' 2"  (1.575 m)   Wt 149 lb 6.4 oz (67.8 kg)   LMP 07/31/2023 (Exact Date)   SpO2 97%   BMI 27.33 kg/m   Assessment and Plan:  Problem List Items Addressed This Visit       Unprioritized   Generalized anxiety disorder (Chronic)    Recurrent symptoms since stopping Prozac Will resume treatment, add Vistaril prn and follow up in one month.      Relevant Medications   FLUoxetine (PROZAC) 20 MG capsule   hydrOXYzine (VISTARIL) 25 MG capsule    Return in about 1 month (around 09/04/2023) for Depression.    Reubin Milan, MD [facility] [place]

## 2023-09-11 ENCOUNTER — Ambulatory Visit: Payer: BC Managed Care – PPO | Admitting: Internal Medicine

## 2023-10-04 ENCOUNTER — Ambulatory Visit: Payer: BC Managed Care – PPO | Admitting: Internal Medicine

## 2023-10-06 ENCOUNTER — Encounter: Payer: Self-pay | Admitting: Internal Medicine

## 2023-10-06 ENCOUNTER — Ambulatory Visit (INDEPENDENT_AMBULATORY_CARE_PROVIDER_SITE_OTHER): Payer: No Typology Code available for payment source | Admitting: Internal Medicine

## 2023-10-06 VITALS — BP 128/70 | HR 71 | Ht 62.0 in | Wt 144.0 lb

## 2023-10-06 DIAGNOSIS — F411 Generalized anxiety disorder: Secondary | ICD-10-CM | POA: Diagnosis not present

## 2023-10-06 DIAGNOSIS — Z1231 Encounter for screening mammogram for malignant neoplasm of breast: Secondary | ICD-10-CM

## 2023-10-06 MED ORDER — FLUOXETINE HCL 20 MG PO CAPS: 20.0000 mg | ORAL_CAPSULE | Freq: Every day | ORAL | 1 refills | Status: DC

## 2023-10-06 NOTE — Assessment & Plan Note (Signed)
Resumed Prozac 20 mg and much improved.  Mood and anxiety are less. No side effects to medication.  Using vistaril only intermittently. Will continue current medications.

## 2023-10-06 NOTE — Patient Instructions (Signed)
Call [facility] to schedule your mammogram at [phone].  

## 2023-10-06 NOTE — Progress Notes (Signed)
Date:  10/06/2023   Name:  ADELENE HEHIR   DOB:  08-26-1981   MRN:  [id number]   Chief Complaint: Depression  Depression        This is a chronic problem.  The problem has been rapidly improving since onset.  Past treatments include SSRIs - Selective serotonin reuptake inhibitors and other medications.  Compliance with treatment is good.  Previous treatment provided significant relief.   Review of Systems  Constitutional:  Negative for chills and fever.  Respiratory:  Negative for chest tightness and shortness of breath.   Cardiovascular:  Negative for chest pain and leg swelling.  Gastrointestinal:  Negative for abdominal pain, constipation, diarrhea and vomiting.  Psychiatric/Behavioral:  Positive for depression and dysphoric mood. Negative for sleep disturbance. The patient is nervous/anxious.      Lab Results  Component Value Date   NA 137 08/07/2018   K 4.7 08/07/2018   CO2 20 08/07/2018   GLUCOSE 82 08/07/2018   BUN 9 08/07/2018   CREATININE 0.52 (L) 08/07/2018   CALCIUM 9.7 08/07/2018   GFRNONAA 124 08/07/2018   No results found for: "CHOL", "HDL", "LDLCALC", "LDLDIRECT", "TRIG", "CHOLHDL" Lab Results  Component Value Date   TSH 1.300 08/07/2018   No results found for: "HGBA1C" Lab Results  Component Value Date   WBC 10.6 08/13/2015   HGB 13.3 08/13/2015   HCT 39.0 08/13/2015   MCV 84.2 08/13/2015   PLT 337 08/13/2015   Lab Results  Component Value Date   ALT 10 08/07/2018   AST 17 08/07/2018   ALKPHOS 79 08/07/2018   BILITOT 0.4 08/07/2018   No results found for: "25OHVITD2", "25OHVITD3", "VD25OH"   Patient Active Problem List   Diagnosis Date Noted   Generalized anxiety disorder 05/03/2016    Allergies  Allergen Reactions   Celexa [Citalopram] Other (See Comments)    Severe nausea, vomiting, diarrhea, chills and fatigue    Past Surgical History:  Procedure Laterality Date   CHOLECYSTECTOMY N/A 06/03/2016   Procedure: LAPAROSCOPIC  CHOLECYSTECTOMY;  Surgeon: Leafy Ro, MD;  Location: [facility];  Service: General;  Laterality: N/A;    Social History   Tobacco Use   Smoking status: Never    Passive exposure: Current   Smokeless tobacco: Never   Tobacco comments:    Exposed to smoke by friends.   Vaping Use   Vaping status: Never Used  Substance Use Topics   Alcohol use: Not Currently   Drug use: Not Currently    Types: Marijuana     Medication list has been reviewed and updated.  Current Meds  Medication Sig   fluocinonide ointment (LIDEX) 0.05 % Apply 1 Application topically 2 (two) times daily. To rash on hands   hydrOXYzine (VISTARIL) 25 MG capsule Take 1 capsule (25 mg total) by mouth every 8 (eight) hours as needed for anxiety.   ibuprofen (ADVIL) 200 MG tablet Take 200 mg by mouth every 6 (six) hours as needed.   norethindrone (MICRONOR) 0.35 MG tablet Take 1 tablet (0.35 mg total) by mouth daily.   [DISCONTINUED] FLUoxetine (PROZAC) 20 MG capsule Take 1 capsule (20 mg total) by mouth daily.       10/06/2023    3:01 PM 08/04/2023   10:30 AM 08/23/2022    2:17 PM 01/10/2022    3:33 PM  GAD 7 : Generalized Anxiety Score  Nervous, Anxious, on Edge 1 3 1  0  Control/stop worrying 1 3 1  0  Worry too much -  different things 1 3 1  0  Trouble relaxing 0 3 0 0  Restless 1 2 0 0  Easily annoyed or irritable 1 2 2 1   Afraid - awful might happen 0 3 0 0  Total GAD 7 Score 5 19 5 1   Anxiety Difficulty Not difficult at all Extremely difficult Somewhat difficult Not difficult at all       10/06/2023    3:01 PM 08/04/2023   10:30 AM 01/18/2023    3:28 PM  Depression screen PHQ 2/9  Decreased Interest 1 1 0  Down, Depressed, Hopeless 1 3 0  PHQ - 2 Score 2 4 0  Altered sleeping 0 3   Tired, decreased energy 2 2   Change in appetite 1 3   Feeling bad or failure about yourself  0 3   Trouble concentrating 1 3   Moving slowly or fidgety/restless 1 1   Suicidal thoughts 0 0   PHQ-9 Score 7 19    Difficult doing work/chores Somewhat difficult Extremely dIfficult     BP Readings from Last 3 Encounters:  10/06/23 128/70  08/04/23 132/78  01/18/23 130/84    Physical Exam Vitals and nursing note reviewed.  Constitutional:      General: She is not in acute distress.    Appearance: Normal appearance. She is well-developed.  HENT:     Head: Normocephalic and atraumatic.  Cardiovascular:     Rate and Rhythm: Normal rate and regular rhythm.  Pulmonary:     Effort: Pulmonary effort is normal. No respiratory distress.     Breath sounds: No wheezing or rhonchi.  Musculoskeletal:     Cervical back: Normal range of motion.     Right lower leg: No edema.     Left lower leg: No edema.  Lymphadenopathy:     Cervical: No cervical adenopathy.  Skin:    General: Skin is warm and dry.     Findings: No rash.  Neurological:     General: No focal deficit present.     Mental Status: She is alert and oriented to person, place, and time.  Psychiatric:        Mood and Affect: Mood normal.        Behavior: Behavior normal.        Thought Content: Thought content normal.        Judgment: Judgment normal.     Wt Readings from Last 3 Encounters:  10/06/23 144 lb (65.3 kg)  08/04/23 149 lb 6.4 oz (67.8 kg)  01/18/23 162 lb (73.5 kg)    BP 128/70   Pulse 71   Ht 5\' 2"  (1.575 m)   Wt 144 lb (65.3 kg)   SpO2 98%   BMI 26.34 kg/m   Assessment and Plan:  Problem List Items Addressed This Visit       Unprioritized   Generalized anxiety disorder (Chronic)    Resumed Prozac 20 mg and much improved.  Mood and anxiety are less. No side effects to medication.  Using vistaril only intermittently. Will continue current medications.      Relevant Medications   FLUoxetine (PROZAC) 20 MG capsule   Other Visit Diagnoses     Encounter for screening mammogram for breast cancer    -  Primary   schedule at [facility]   Relevant Orders   MM 3D SCREENING MAMMOGRAM BILATERAL BREAST       No  follow-ups on file.    Reubin Milan, MD [facility] [place]

## 2023-10-31 ENCOUNTER — Ambulatory Visit
Admission: RE | Admit: 2023-10-31 | Discharge: 2023-10-31 | Disposition: A | Discharge status: Home / Self Care | Payer: No Typology Code available for payment source | Source: Ambulatory Visit | Attending: Internal Medicine | Admitting: Internal Medicine

## 2023-10-31 DIAGNOSIS — Z1231 Encounter for screening mammogram for malignant neoplasm of breast: Secondary | ICD-10-CM | POA: Insufficient documentation

## 2024-01-02 ENCOUNTER — Ambulatory Visit (LOCAL_COMMUNITY_HEALTH_CENTER): Payer: No Typology Code available for payment source

## 2024-01-02 VITALS — BP 120/80 | Ht 62.0 in | Wt 156.0 lb

## 2024-01-02 DIAGNOSIS — Z308 Encounter for other contraceptive management: Secondary | ICD-10-CM

## 2024-01-02 DIAGNOSIS — Z3009 Encounter for other general counseling and advice on contraception: Secondary | ICD-10-CM

## 2024-01-02 DIAGNOSIS — Z30011 Encounter for initial prescription of contraceptive pills: Secondary | ICD-10-CM | POA: Diagnosis not present

## 2024-01-02 DIAGNOSIS — Z3041 Encounter for surveillance of contraceptive pills: Secondary | ICD-10-CM

## 2024-01-02 MED ORDER — NORETHINDRONE 0.35 MG PO TABS
1.0000 | ORAL_TABLET | Freq: Every day | ORAL | 0 refills | Status: DC
Start: 2024-01-02 — End: 2024-01-22

## 2024-01-02 NOTE — Progress Notes (Signed)
 Patient in nurse clinic for ocp refill (Micronor .) Per patient, taking ocp correctly as prescribed and denies missing any pills. Takes ocp same time daily.  Patient states she has one and 1/2 weeks left in last ocp pack. [facility]   Consult VEAR Bers, FNP who gives verbal order for RN to send E Rx to patient pharmacy for Micronor  #1 pack.  RN sent E Rx and informed patient.  Annual PE due 01/20/2024, has reminder. Patient informed to schedule annual PE before she runs out of ocp. Verbalizes understanding.  Phelix Fudala, RN

## 2024-01-22 ENCOUNTER — Ambulatory Visit: Payer: No Typology Code available for payment source | Admitting: Nurse Practitioner

## 2024-01-22 VITALS — BP 122/84 | HR 81 | Ht 62.0 in | Wt 154.6 lb

## 2024-01-22 DIAGNOSIS — Z3009 Encounter for other general counseling and advice on contraception: Secondary | ICD-10-CM

## 2024-01-22 DIAGNOSIS — Z0000 Encounter for general adult medical examination without abnormal findings: Secondary | ICD-10-CM

## 2024-01-22 DIAGNOSIS — Z3041 Encounter for surveillance of contraceptive pills: Secondary | ICD-10-CM | POA: Diagnosis not present

## 2024-01-22 MED ORDER — NORETHINDRONE 0.35 MG PO TABS: 1.0000 | ORAL_TABLET | Freq: Every day | ORAL | 13 refills | Status: AC

## 2024-01-22 NOTE — Progress Notes (Signed)
[employer] [facility] DEPARTMENT [facility] [address] [place] [address] Main phone: [phone]  Family Planning Visit - Repeat Yearly Visit  Subjective:  JOSEFINA Wise is a 43 y.o. G2P2002  being seen today for an annual wellness visit and to discuss contraception options.   The patient is currently using Oral Contraceptive for pregnancy prevention. Patient does not want a pregnancy in the next year.   Patient reports they are looking for a method with the following characteristics:  High efficacy at preventing pregnancy  Patient has the following medical problems:  Patient Active Problem List   Diagnosis Date Noted   Generalized anxiety disorder 05/03/2016    Chief Complaint  Patient presents with   Annual Exam    Physical and birth control Geophysicist/field seismologist)    HPI Patient reports 1 female partner for the past 12 months, vaginal sex and sometimes oral, never uses condoms, and has no history of STD. She indicates no symptoms of STI today. She reports taking POPs to prevent pregnancy and missed one earlier last week maybe on 01/15/24. She states she then took 2 pills the following day, but did not use condoms with sex since then. She reports missing a pill about once per month. States her LMP was 12/30/23, but has been spotting since then.  Patient indicates having a mammogram done a few months ago. She is due for a CBE today. Last PAP 2021: NILM, HPV neg. Next PAP due 12/2024.  Review of Systems  Constitutional:  Negative for weight loss.  Eyes:  Negative for blurred vision.  Respiratory:  Negative for cough and shortness of breath.   Cardiovascular:  Negative for claudication.  Gastrointestinal:  Negative for nausea.  Genitourinary:  Negative for dysuria and frequency.  Skin:  Negative for rash.  Neurological:  Negative for headaches.  Endo/Heme/Allergies:  Does not bruise/bleed easily.    See flowsheet for other program required  questions.   Diabetes screening This patient is 43 y.o. with a BMI of Body mass index is 28.28 kg/m.Marland Kitchen  Is patient eligible for diabetes screening (age >35 and BMI >25)?  yes  Was Hgb A1c ordered? No-Patient declined and reports having a PCP which she sees biannually.   STI screening Patient reports 1 of partners in last year.  Does this patient desire STI screening?  No - personal choice.   Hepatitis C screening Has patient been screened once for HCV in the past?  No according to records on file and patient unaware.   No results found for: "HCVAB"  Does the patient meet criteria for HCV testing? Yes -Not performed in office today as patient declines for personal reasons.  (If yes-- Screen for HCV through [facility]) Criteria:  Since the last HCV result, does the patient have any of the following? - Current drug use - Have a partner with drug use - Has been incarcerated  Hepatitis B screening Does the patient meet criteria for HBV testing? Yes Criteria:  -Household, sexual or needle sharing contact with HBV -History of drug use -HIV positive -Those with known Hep C  Cervical Cancer Screening  Result Date Procedure Results Follow-ups  01/16/2020 HM PAP SMEAR HM Pap smear: neg with neg HPV   08/06/2018 Pap IG and HPV (high risk) DNA detection Pap Smear: NILM HPV: HRHPV - due 08/07/2023  08/06/2018 HM PAP SMEAR HM Pap smear: Negative, HPV negative     Health Maintenance Due  Topic Date Due  Hepatitis C Screening  Never done   Cervical Cancer Screening (HPV/Pap Cotest)  01/15/2023   COVID-19 Vaccine (1 - 2024-25 season) Never done    The following portions of the patient's history were reviewed and updated as appropriate: allergies, current medications, past family history, past medical history, past social history, past surgical history and problem list. Problem list updated.  Objective:   Vitals:   01/22/24 0827  BP: 122/84  Pulse: 81  Weight: 154 lb 9.6 oz (70.1  kg)  Height: 5\' 2"  (1.575 m)    Physical Exam Vitals and nursing note reviewed.  Constitutional:      Appearance: Normal appearance.  HENT:     Head: Normocephalic.     Salivary Glands: Right salivary gland is not diffusely enlarged or tender. Left salivary gland is not diffusely enlarged or tender.     Mouth/Throat:     Lips: Pink. No lesions.     Mouth: Mucous membranes are moist.     Tongue: No lesions. Tongue does not deviate from midline.     Pharynx: Oropharynx is clear. Uvula midline.     Tonsils: No tonsillar exudate.      Comments: Tooth indicated in diagram (upper left lateral incisor) with decay present.  Eyes:     General:        Right eye: No discharge.        Left eye: No discharge.     Conjunctiva/sclera:     Right eye: Right conjunctiva is not injected.     Left eye: Left conjunctiva is not injected.  Neck:     Thyroid: No thyroid mass, thyromegaly or thyroid tenderness.     Trachea: Trachea and phonation normal. No tracheal tenderness or tracheal deviation.  Cardiovascular:     Rate and Rhythm: Normal rate and regular rhythm.     Heart sounds: Normal heart sounds, S1 normal and S2 normal.  Pulmonary:     Effort: Pulmonary effort is normal.     Breath sounds: Normal breath sounds and air entry.  Chest:  Breasts:    Tanner Score is 5.     Breasts are symmetrical.     Right: Normal. No swelling, bleeding, inverted nipple, mass, nipple discharge, skin change or tenderness.     Left: Normal. No swelling, bleeding, inverted nipple, mass, nipple discharge, skin change or tenderness.  Abdominal:     General: Abdomen is flat. Bowel sounds are increased. There is no distension.     Palpations: Abdomen is soft.     Tenderness: There is no abdominal tenderness. There is no guarding or rebound.  Genitourinary:    Comments: Patient declined genital exam. No symptoms and declined STI testing.  Lymphadenopathy:     Head:     Right side of head: No submental,  submandibular, tonsillar, preauricular or posterior auricular adenopathy.     Left side of head: No submental, submandibular, tonsillar, preauricular or posterior auricular adenopathy.     Cervical: No cervical adenopathy.     Right cervical: No superficial or posterior cervical adenopathy.    Left cervical: No superficial or posterior cervical adenopathy.     Upper Body:     Right upper body: No supraclavicular or axillary adenopathy.     Left upper body: No supraclavicular or axillary adenopathy.  Skin:    General: Skin is warm and dry.     Findings: No rash.     Comments: Exposed areas only and back. Skin tone appropriate for ethnicity.  Neurological:     Mental Status: She is alert and oriented to person, place, and time.  Psychiatric:        Attention and Perception: Attention and perception normal.        Mood and Affect: Mood and affect normal.        Speech: Speech normal.        Behavior: Behavior normal. Behavior is cooperative.        Thought Content: Thought content normal.     Assessment and Plan:  MEIKO IVES is a 43 y.o. female G2P2002 presenting to the [facility] Department for an yearly wellness and contraception visit  1. Family planning services Contraception counseling: Reviewed options based on patient desire and reproductive life plan. Patient is interested in Oral Contraceptive. This was provided to the patient today.   Risks, benefits, and typical effectiveness rates were reviewed.  Questions were answered.  Written information was also given to the patient to review.    The patient will follow up in  1 years for surveillance.  The patient was told to call with any further questions, or with any concerns about this method of contraception.  Emphasized use of condoms 100% of the time for STI prevention.  Educated on ECP and assessed need for ECP. Patient was NOT offered ECP based on NO unprotected sex in the last 5 days.   - norethindrone  (MICRONOR) 0.35 MG tablet; Take 1 tablet (0.35 mg total) by mouth daily.  Dispense: 28 tablet; Refill: 13  2. Well woman exam (no gynecological exam) (Primary) CBE performed today. Next due in 3 years unless concerns arise. Next mammogram due later this year. PAP due 12/2024. Patient made aware of all of these health maintenance screenings.   Patient declined STI testing today including blood labs for HIV/HBV/HCV/Syphilis as well as vaginal swabs for gonorrhea/chlamydia/trich and declined oral swab for gonorrhea.   Patient has a PCP, but a family planning packet was given by the RN with a list of PCPs and additional information on local resources including dental providers in the area for cleaning and evaluation d/t tooth decay present on exam and overall oral health.    Return in about 1 year (around 01/21/2025) for annual well-woman exam.  No future appointments.  Edmonia James, NP

## 2024-01-22 NOTE — Progress Notes (Signed)
Pt here for annual physical and birth control.  Declines STI screening.  Micronor prescription sent to patient's pharmacy for pick up.  Condoms declined.  Pt to call clinic if any questions or concerns.-Rojelio Uhrich, RN

## 2024-05-23 ENCOUNTER — Other Ambulatory Visit: Payer: Self-pay | Admitting: Internal Medicine

## 2024-05-23 DIAGNOSIS — F411 Generalized anxiety disorder: Secondary | ICD-10-CM

## 2024-05-23 NOTE — Telephone Encounter (Unsigned)
 Copied from CRM [id number]. Topic: Clinical - Medication Refill >> May 23, 2024  8:54 AM Rosamond Comes wrote: Medication: FLUoxetine  (PROZAC ) 20 MG capsule  Has the patient contacted their pharmacy? No No refills on medication bottle  This is the patient's preferred pharmacy:   [facility] [address], [place] - [address] [place] [address] Phone: [phone] Fax: [phone]  Is this the correct pharmacy for this prescription? Yes If no, delete pharmacy and type the correct one.   Has the prescription been filled recently? No  Is the patient out of the medication? No  Has the patient been seen for an appointment in the last year OR does the patient have an upcoming appointment? Yes  Can we respond through MyChart? Yes  Agent: Please be advised that Rx refills may take up to 3 business days. We ask that you follow-up with your pharmacy.

## 2024-05-24 MED ORDER — FLUOXETINE HCL 20 MG PO CAPS: 20.0000 mg | ORAL_CAPSULE | Freq: Every day | ORAL | 0 refills | Status: DC

## 2024-05-24 NOTE — Telephone Encounter (Signed)
 Courtesy refill. Patient will need an office visit for additional refills.  Requested Prescriptions  Pending Prescriptions Disp Refills   FLUoxetine  (PROZAC ) 20 MG capsule 15 capsule 0    Sig: Take 1 capsule (20 mg total) by mouth daily.     Psychiatry:  Antidepressants - SSRI Failed - 05/24/2024 10:51 AM      Failed - Valid encounter within last 6 months    Recent Outpatient Visits   None

## 2024-06-03 ENCOUNTER — Ambulatory Visit: Admitting: Internal Medicine

## 2024-06-08 ENCOUNTER — Other Ambulatory Visit: Payer: Self-pay | Admitting: Internal Medicine

## 2024-06-08 NOTE — Progress Notes (Unsigned)
 Date:  06/08/2024   Name:  Yvette Wise   DOB:  27-May-1981   MRN:  [id number]   Chief Complaint: No chief complaint on file.  HPI  Review of Systems   Lab Results  Component Value Date   NA 137 08/07/2018   K 4.7 08/07/2018   CO2 20 08/07/2018   GLUCOSE 82 08/07/2018   BUN 9 08/07/2018   CREATININE 0.52 (L) 08/07/2018   CALCIUM 9.7 08/07/2018   GFRNONAA 124 08/07/2018   No results found for: CHOL, HDL, LDLCALC, LDLDIRECT, TRIG, CHOLHDL Lab Results  Component Value Date   TSH 1.300 08/07/2018   No results found for: HGBA1C Lab Results  Component Value Date   WBC 10.6 08/13/2015   HGB 13.3 08/13/2015   HCT 39.0 08/13/2015   MCV 84.2 08/13/2015   PLT 337 08/13/2015   Lab Results  Component Value Date   ALT 10 08/07/2018   AST 17 08/07/2018   ALKPHOS 79 08/07/2018   BILITOT 0.4 08/07/2018   No results found for: MARIEN BOLLS, VD25OH   Patient Active Problem List   Diagnosis Date Noted   Generalized anxiety disorder 05/03/2016    Allergies  Allergen Reactions   Celexa  [Citalopram ] Other (See Comments)    Severe nausea, vomiting, diarrhea, chills and fatigue    Past Surgical History:  Procedure Laterality Date   CHOLECYSTECTOMY N/A 06/03/2016   Procedure: LAPAROSCOPIC CHOLECYSTECTOMY;  Surgeon: Laneta JULIANNA Luna, MD;  Location: [facility] ORS;  Service: General;  Laterality: N/A;    Social History   Tobacco Use   Smoking status: Never    Passive exposure: Current   Smokeless tobacco: Never   Tobacco comments:    Exposed to smoke by friends.   Vaping Use   Vaping status: Never Used  Substance Use Topics   Alcohol use: Not Currently    Comment: Last drink 1 month ago-Liquor X 3 shots   Drug use: Not Currently    Types: Marijuana    Comment: Last use 6 months ago     Medication list has been reviewed and updated.  No outpatient medications have been marked as taking for the 06/08/24 encounter (Orders Only) with Justus Leita DEL, MD.       10/06/2023    3:01 PM 08/04/2023   10:30 AM 08/23/2022    2:17 PM 01/10/2022    3:33 PM  GAD 7 : Generalized Anxiety Score  Nervous, Anxious, on Edge 1 3 1  0  Control/stop worrying 1 3 1  0  Worry too much - different things 1 3 1  0  Trouble relaxing 0 3 0 0  Restless 1 2 0 0  Easily annoyed or irritable 1 2 2 1   Afraid - awful might happen 0 3 0 0  Total GAD 7 Score 5 19 5 1   Anxiety Difficulty Not difficult at all Extremely difficult Somewhat difficult Not difficult at all       01/22/2024    8:30 AM 10/06/2023    3:01 PM 08/04/2023   10:30 AM  Depression screen PHQ 2/9  Decreased Interest 0 1 1  Down, Depressed, Hopeless 0 1 3  PHQ - 2 Score 0 2 4  Altered sleeping  0 3  Tired, decreased energy  2 2  Change in appetite  1 3  Feeling bad or failure about yourself   0 3  Trouble concentrating  1 3  Moving slowly or fidgety/restless  1 1  Suicidal thoughts  0  0  PHQ-9 Score  7 19  Difficult doing work/chores  Somewhat difficult Extremely dIfficult    BP Readings from Last 3 Encounters:  01/22/24 122/84  01/02/24 120/80  10/06/23 128/70    Physical Exam  Wt Readings from Last 3 Encounters:  01/22/24 154 lb 9.6 oz (70.1 kg)  01/02/24 156 lb (70.8 kg)  10/06/23 144 lb (65.3 kg)    There were no vitals taken for this visit.  Assessment and Plan:  Problem List Items Addressed This Visit   None   No follow-ups on file.    Leita HILARIO Adie, MD [facility] [place]

## 2024-06-10 ENCOUNTER — Encounter: Payer: Self-pay | Admitting: Internal Medicine

## 2024-06-10 ENCOUNTER — Ambulatory Visit: Admitting: Internal Medicine

## 2024-06-10 VITALS — BP 118/76 | HR 99 | Ht 62.0 in | Wt 155.0 lb

## 2024-06-10 DIAGNOSIS — F411 Generalized anxiety disorder: Secondary | ICD-10-CM | POA: Diagnosis not present

## 2024-06-10 DIAGNOSIS — R5383 Other fatigue: Secondary | ICD-10-CM

## 2024-06-10 MED ORDER — FLUOXETINE HCL 20 MG PO CAPS: 20.0000 mg | ORAL_CAPSULE | Freq: Every day | ORAL | 1 refills | Status: DC

## 2024-06-10 NOTE — Progress Notes (Signed)
 Date:  06/10/2024   Name:  Yvette Wise   DOB:  1981/08/05   MRN:  [id number]   Chief Complaint: Anxiety and Medication Refill  Anxiety Presents for follow-up visit. Symptoms include excessive worry, nervous/anxious behavior and restlessness. Patient reports no chest pain, dizziness, palpitations, shortness of breath or suicidal ideas. Symptoms occur occasionally. The quality of sleep is good.   Compliance with medications is 76-100%.  Fatigue - she has noticed more fatigue recently.  After working all day she wants to rest.  She sleeps well, wakes refreshed.  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  Her menses are heavier than usual despite [employer]  OCP. No chest pain or shortness of breath, no leg swelling or headaches.  Review of Systems  Constitutional:  Positive for fatigue. Negative for chills and fever.  HENT:  Negative for trouble swallowing.   Respiratory:  Negative for chest tightness, shortness of breath and wheezing.   Cardiovascular:  Negative for chest pain and palpitations.  Genitourinary:  Positive for menstrual problem.  Musculoskeletal:  Negative for arthralgias, gait problem, joint swelling and myalgias.  Neurological:  Negative for dizziness and headaches.  Psychiatric/Behavioral:  Positive for dysphoric mood. Negative for sleep disturbance and suicidal ideas. The patient is nervous/anxious.      Lab Results  Component Value Date   NA 137 08/07/2018   K 4.7 08/07/2018   CO2 20 08/07/2018   GLUCOSE 82 08/07/2018   BUN 9 08/07/2018   CREATININE 0.52 (L) 08/07/2018   CALCIUM 9.7 08/07/2018   GFRNONAA 124 08/07/2018   No results found for: CHOL, HDL, LDLCALC, LDLDIRECT, TRIG, CHOLHDL Lab Results  Component Value Date   TSH 1.300 08/07/2018   No results found for: HGBA1C Lab Results  Component Value Date   WBC 10.6 08/13/2015   HGB 13.3 08/13/2015   HCT 39.0 08/13/2015   MCV 84.2 08/13/2015   PLT 337 08/13/2015    Lab Results  Component Value Date   ALT 10 08/07/2018   AST 17 08/07/2018   ALKPHOS 79 08/07/2018   BILITOT 0.4 08/07/2018   No results found for: MARIEN BOLLS, VD25OH   Patient Active Problem List   Diagnosis Date Noted   Generalized anxiety disorder 05/03/2016    Allergies  Allergen Reactions   Celexa  [Citalopram ] Other (See Comments)    Severe nausea, vomiting, diarrhea, chills and fatigue    Past Surgical History:  Procedure Laterality Date   CHOLECYSTECTOMY N/A 06/03/2016   Procedure: LAPAROSCOPIC CHOLECYSTECTOMY;  Surgeon: Laneta JULIANNA Luna, MD;  Location: [facility] ORS;  Service: General;  Laterality: N/A;    Social History   Tobacco Use   Smoking status: Never    Passive exposure: Current   Smokeless tobacco: Never   Tobacco comments:    Exposed to smoke by friends.   Vaping Use   Vaping status: Never Used  Substance Use Topics   Alcohol use: Not Currently    Comment: Last drink 1 month ago-Liquor X 3 shots   Drug use: Not Currently    Types: Marijuana    Comment: Last use 6 months ago     Medication list has been reviewed and updated.  Current Meds  Medication Sig   ibuprofen (ADVIL) 200 MG tablet Take 200 mg by mouth every 6 (six) hours as needed.   norethindrone  (MICRONOR ) 0.35 MG tablet Take 1 tablet (0.35 mg total) by mouth daily.   [DISCONTINUED] FLUoxetine  (PROZAC ) 20 MG capsule Take 1 capsule (  20 mg total) by mouth daily.       06/10/2024   11:18 AM 10/06/2023    3:01 PM 08/04/2023   10:30 AM 08/23/2022    2:17 PM  GAD 7 : Generalized Anxiety Score  Nervous, Anxious, on Edge 1 1 3 1   Control/stop worrying 1 1 3 1   Worry too much - different things 1 1 3 1   Trouble relaxing 0 0 3 0  Restless 0 1 2 0  Easily annoyed or irritable 3 1 2 2   Afraid - awful might happen 2 0 3 0  Total GAD 7 Score 8 5 19 5   Anxiety Difficulty Somewhat difficult Not difficult at all Extremely difficult Somewhat difficult       06/10/2024   11:18 AM  01/22/2024    8:30 AM 10/06/2023    3:01 PM  Depression screen PHQ 2/9  Decreased Interest 2 0 1  Down, Depressed, Hopeless 0 0 1  PHQ - 2 Score 2 0 2  Altered sleeping 0  0  Tired, decreased energy 3  2  Change in appetite 0  1  Feeling bad or failure about yourself  0  0  Trouble concentrating 1  1  Moving slowly or fidgety/restless 0  1  Suicidal thoughts 0  0  PHQ-9 Score 6  7  Difficult doing work/chores Somewhat difficult  Somewhat difficult    BP Readings from Last 3 Encounters:  06/10/24 118/76  01/22/24 122/84  01/02/24 120/80    Physical Exam Vitals and nursing note reviewed.  Constitutional:      General: She is not in acute distress.    Appearance: Normal appearance. She is well-developed.  HENT:     Head: Normocephalic and atraumatic.  Neck:     Vascular: No carotid bruit.   Cardiovascular:     Rate and Rhythm: Normal rate and regular rhythm.     Heart sounds: No murmur heard. Pulmonary:     Effort: Pulmonary effort is normal. No respiratory distress.     Breath sounds: No wheezing or rhonchi.   Musculoskeletal:     Cervical back: Normal range of motion.     Right lower leg: No edema.     Left lower leg: No edema.  Lymphadenopathy:     Cervical: No cervical adenopathy.   Skin:    General: Skin is warm and dry.     Capillary Refill: Capillary refill takes less than 2 seconds.     Findings: No rash.   Neurological:     General: No focal deficit present.     Mental Status: She is alert and oriented to person, place, and time.   Psychiatric:        Mood and Affect: Mood normal.        Behavior: Behavior normal.     Wt Readings from Last 3 Encounters:  06/10/24 155 lb (70.3 kg)  01/22/24 154 lb 9.6 oz (70.1 kg)  01/02/24 156 lb (70.8 kg)    BP 118/76   Pulse 99   Ht 5' 2 (1.575 m)   Wt 155 lb (70.3 kg)   LMP 06/05/2024 (Approximate)   SpO2 98%   BMI 28.35 kg/m   Assessment and Plan:  Problem List Items Addressed This Visit        Unprioritized   Generalized anxiety disorder - Primary (Chronic)   Doing fairly well on Prozac  daily. No SI/HI noted.  She cares for her disabled son who is 28. Will continue  current regimen.       Relevant Medications   FLUoxetine  (PROZAC ) 20 MG capsule   Other Relevant Orders   TSH + free T4   Other Visit Diagnoses       Fatigue, unspecified type       obtain screening labs - if normal would recommend a Multi vit/multi mineral daily otherwise advise once these return   Relevant Orders   CBC with Differential/Platelet   Comprehensive metabolic panel with GFR       No follow-ups on file.    Leita HILARIO Adie, MD [facility] [place]

## 2024-06-10 NOTE — Assessment & Plan Note (Addendum)
 Doing fairly well on Prozac  daily. No SI/HI noted.  She cares for her disabled son who is 27. Will continue current regimen.

## 2024-06-11 ENCOUNTER — Ambulatory Visit: Payer: Self-pay | Admitting: Internal Medicine

## 2024-06-11 LAB — COMPREHENSIVE METABOLIC PANEL WITH GFR
ALT: 13 IU/L (ref 0–32)
AST: 16 IU/L (ref 0–40)
Albumin: 4.1 g/dL (ref 3.9–4.9)
Alkaline Phosphatase: 79 IU/L (ref 44–121)
BUN/Creatinine Ratio: 15 (ref 9–23)
BUN: 11 mg/dL (ref 6–24)
Bilirubin Total: <0.2 mg/dL (ref 0.0–1.2)
CO2: 21 mmol/L (ref 20–29)
Calcium: 9.6 mg/dL (ref 8.7–10.2)
Chloride: 103 mmol/L (ref 96–106)
Creatinine, Ser: 0.71 mg/dL (ref 0.57–1.00)
Globulin, Total: 2.8 g/dL (ref 1.5–4.5)
Glucose: 78 mg/dL (ref 70–99)
Potassium: 4.5 mmol/L (ref 3.5–5.2)
Sodium: 138 mmol/L (ref 134–144)
Total Protein: 6.9 g/dL (ref 6.0–8.5)
eGFR: 109 mL/min/{1.73_m2} (ref >59)

## 2024-06-11 LAB — CBC WITH DIFFERENTIAL/PLATELET
Basophils Absolute: 0 10*3/uL (ref 0.0–0.2)
Basos: 0 %
EOS (ABSOLUTE): 0.2 10*3/uL (ref 0.0–0.4)
Eos: 2 %
Hematocrit: 38.7 % (ref 34.0–46.6)
Hemoglobin: 12.5 g/dL (ref 11.1–15.9)
Immature Grans (Abs): 0 10*3/uL (ref 0.0–0.1)
Immature Granulocytes: 0 %
Lymphocytes Absolute: 3.1 10*3/uL (ref 0.7–3.1)
Lymphs: 34 %
MCH: 29.1 pg (ref 26.6–33.0)
MCHC: 32.3 g/dL (ref 31.5–35.7)
MCV: 90 fL (ref 79–97)
Monocytes Absolute: 0.8 10*3/uL (ref 0.1–0.9)
Monocytes: 9 %
Neutrophils Absolute: 5.1 10*3/uL (ref 1.4–7.0)
Neutrophils: 55 %
Platelets: 424 10*3/uL (ref 150–450)
RBC: 4.29 x10E6/uL (ref 3.77–5.28)
RDW: 12.8 % (ref 11.7–15.4)
WBC: 9.2 10*3/uL (ref 3.4–10.8)

## 2024-06-11 LAB — TSH+FREE T4
Free T4: 0.96 ng/dL (ref 0.82–1.77)
TSH: 2.2 u[IU]/mL (ref 0.450–4.500)

## 2024-10-02 ENCOUNTER — Other Ambulatory Visit: Payer: Self-pay | Admitting: Internal Medicine

## 2024-10-02 DIAGNOSIS — Z1231 Encounter for screening mammogram for malignant neoplasm of breast: Secondary | ICD-10-CM

## 2024-10-20 ENCOUNTER — Ambulatory Visit
Admission: EM | Admit: 2024-10-20 | Discharge: 2024-10-20 | Disposition: A | Discharge status: Home / Self Care | Attending: Family Medicine | Admitting: Family Medicine

## 2024-10-20 ENCOUNTER — Encounter: Payer: Self-pay | Admitting: Emergency Medicine

## 2024-10-20 DIAGNOSIS — K029 Dental caries, unspecified: Secondary | ICD-10-CM | POA: Diagnosis not present

## 2024-10-20 MED ORDER — FLUCONAZOLE 150 MG PO TABS: 150.0000 mg | ORAL_TABLET | Freq: Once | ORAL | 0 refills | Status: AC

## 2024-10-20 MED ORDER — FLUCONAZOLE 150 MG PO TABS: 150.0000 mg | ORAL_TABLET | Freq: Once | ORAL | 0 refills | Status: DC

## 2024-10-20 MED ORDER — AMOXICILLIN-POT CLAVULANATE 875-125 MG PO TABS: 1.0000 | ORAL_TABLET | Freq: Two times a day (BID) | ORAL | 0 refills | Status: AC

## 2024-10-20 NOTE — Discharge Instructions (Addendum)
 At this time there is no abscess to be drained. You were prescribed an antibiotic. Please take this exactly as directed and do not stop taking it until the entire course of medicine is finished, even if you begin to feel better before finishing the course.   Schedule an appointment with your dentist or call local dentists to see if they take your insurance or can do a payment payment plan.  [facility] [employer] [facility] are options.

## 2024-10-20 NOTE — ED Provider Notes (Signed)
 [facility]    CSN: [id number] Arrival date & time: 10/20/24  1217      History   Chief Complaint Chief Complaint  Patient presents with   Dental Pain    HPI Yvette Wise is a 43 y.o. female.   HPI  Yvette Wise presents for left lower dental pain for the past 3 days. She has a known cracked molar in that location. She has pain with eating and drinking. Pain radiates to her neck and ear on the left side. Took some ibuprofen earlier with some relief.  She doesn't currently have a dentist but does have dental insurance.  No fever. Aggie has otherwise been well and has no other concerns.   Past Medical History:  Diagnosis Date   Depression     Patient Active Problem List   Diagnosis Date Noted   Generalized anxiety disorder 05/03/2016    Past Surgical History:  Procedure Laterality Date   CHOLECYSTECTOMY N/A 06/03/2016   Procedure: LAPAROSCOPIC CHOLECYSTECTOMY;  Surgeon: Laneta JULIANNA Luna, MD;  Location: [facility];  Service: General;  Laterality: N/A;    OB History     Gravida  2   Para  2   Term  2   Preterm  0   AB  0   Living  2      SAB  0   IAB  0   Ectopic  0   Multiple  0   Live Births  0            Home Medications    Prior to Admission medications   Medication Sig Start Date End Date Taking? Authorizing Provider  amoxicillin -clavulanate (AUGMENTIN ) 875-125 MG tablet Take 1 tablet by mouth every 12 (twelve) hours. 10/20/24  Yes Lynnzie Blackson, DO  fluconazole (DIFLUCAN) 150 MG tablet Take 1 tablet (150 mg total) by mouth once for 1 dose. 10/20/24 10/20/24  Jacier Gladu, DO  FLUoxetine  (PROZAC ) 20 MG capsule Take 1 capsule (20 mg total) by mouth daily. 06/10/24   Berglund, Laura H, MD  ibuprofen (ADVIL) 200 MG tablet Take 200 mg by mouth every 6 (six) hours as needed.    [provider]  norethindrone  (MICRONOR ) 0.35 MG tablet Take 1 tablet (0.35 mg total) by mouth daily. 01/22/24 02/17/25  Birdena Clarita LITTIE, NP    Family  History Family History  Problem Relation Age of Onset   CVA Mother    Depression Mother    Anxiety disorder Mother    Clotting disorder Mother    Hypertension Mother    Muscular dystrophy Son    Lung cancer Father    Depression Maternal Grandmother    Breast cancer Neg Hx     Social History Social History   Tobacco Use   Smoking status: Never    Passive exposure: Current   Smokeless tobacco: Never   Tobacco comments:    Exposed to smoke by friends.   Vaping Use   Vaping status: Never Used  Substance Use Topics   Alcohol use: Not Currently    Comment: Last drink 1 month ago-Liquor X 3 shots   Drug use: Not Currently    Types: Marijuana    Comment: Last use 6 months ago     Allergies   Celexa  [citalopram ]   Review of Systems Review of Systems : negative unless otherwise stated in HPI.      Physical Exam Triage Vital Signs ED Triage Vitals  Encounter Vitals Group     BP  10/20/24 1238 (!) 156/91     Girls Systolic BP Percentile --      Girls Diastolic BP Percentile --      Boys Systolic BP Percentile --      Boys Diastolic BP Percentile --      Pulse Rate 10/20/24 1238 78     Resp 10/20/24 1238 14     Temp 10/20/24 1238 98.6 F (37 C)     Temp Source 10/20/24 1238 Oral     SpO2 10/20/24 1238 97 %     Weight 10/20/24 1235 154 lb 15.7 oz (70.3 kg)     Height 10/20/24 1235 5' 2 (1.575 m)     Head Circumference --      Peak Flow --      Pain Score 10/20/24 1235 8     Pain Loc --      Pain Education --      Exclude from Growth Chart --    No data found.  Updated Vital Signs BP (!) 156/91 (BP Location: Right Arm)   Pulse 78   Temp 98.6 F (37 C) (Oral)   Resp 14   Ht 5' 2 (1.575 m)   Wt 70.3 kg   LMP 09/12/2024 (Approximate)   SpO2 97%   BMI 28.35 kg/m   Visual Acuity Right Eye Distance:   Left Eye Distance:   Bilateral Distance:    Right Eye Near:   Left Eye Near:    Bilateral Near:     Physical Exam  GEN:     alert, well appearing  female in no distress    HENT:  mucus membranes moist, oropharyngeal without lesions exudates or erythema, nasal discharge, lower left 1st molar tender to percussion with crack-off visible,  no trismus, no secretion pooling, no palpable induration and no visible swelling of the floor the mouth, normal jaw movement without difficulty EYES:   no scleral injection or discharge NECK:  normal ROM RESP:  no increased work of breathing CVS:   regular rate  Skin:   warm and dry, no rash or skin changes of on external jaw    UC Treatments / Results  Labs (all labs ordered are listed, but only abnormal results are displayed) Labs Reviewed - No data to display  EKG   Radiology No results found.  Procedures Procedures (including critical care time)  Medications Ordered in UC Medications - No data to display  Initial Impression / Assessment and Plan / UC Course  I have reviewed the triage vital signs and the nursing notes.  Pertinent labs & imaging results that were available during my care of the patient were reviewed by me and considered in my medical decision making (see chart for details).     Pt is a 43 y.o. female who presents for 3 days of dental pain. Shelsey is afebrile here without recent antipyretics. Satting well on room air. Overall pt is well appearing, well hydrated, without respiratory distress.  Dental exam concerning for developing dental abscess at the site of known crackoff.   Antibiotics prescribed as below. Diflucan for antibiotic associated yeast infection prevention.  - continue Tylenol  with Motrin as needed for discomfort - Gargle with salt water several times a day - Establish care with a dentist or follow up with local dentist.  Given information for [facility] dental clinic and [facility] in [place]  - Discussed  ED precautions, understanding voiced.   Discussed MDM, treatment plan and plan for follow-up with  patient who agrees with plan.   Final  Clinical Impressions(s) / UC Diagnoses   Final diagnoses:  Pain due to dental caries     Discharge Instructions      At this time there is no abscess to be drained. You were prescribed an antibiotic. Please take this exactly as directed and do not stop taking it until the entire course of medicine is finished, even if you begin to feel better before finishing the course.   Schedule an appointment with your dentist or call local dentists to see if they take your insurance or can do a payment payment plan.  [facility] [employer] [facility] are options.      ED Prescriptions     Medication Sig Dispense Auth. Provider   amoxicillin -clavulanate (AUGMENTIN ) 875-125 MG tablet Take 1 tablet by mouth every 12 (twelve) hours. 14 tablet Jovee Dettinger, DO   fluconazole (DIFLUCAN) 150 MG tablet Take 1 tablet (150 mg total) by mouth once for 1 dose. 1 tablet Kriste Berth, DO      PDMP not reviewed this encounter.   Kriste Berth, DO 10/20/24 1325

## 2024-10-20 NOTE — ED Triage Notes (Signed)
 Patient c/o left lower tooth pain that started Thursday.  Patient states that the pain radiates up her left jaw.  Patient unsure of fevers.

## 2024-11-04 ENCOUNTER — Ambulatory Visit

## 2024-12-04 ENCOUNTER — Other Ambulatory Visit: Payer: Self-pay | Admitting: Internal Medicine

## 2024-12-04 DIAGNOSIS — F411 Generalized anxiety disorder: Secondary | ICD-10-CM

## 2024-12-04 NOTE — Telephone Encounter (Unsigned)
 Copied from CRM #[id number]. Topic: Clinical - Medication Refill >> Dec 04, 2024 10:28 AM Rosaria E wrote: Medication: FLUoxetine  (PROZAC ) 20 MG capsule  Has the patient contacted their pharmacy? Yes (Agent: If no, request that the patient contact the pharmacy for the refill. If patient does not wish to contact the pharmacy document the reason why and proceed with request.) (Agent: If yes, when and what did the pharmacy advise?)  This is the patient's preferred pharmacy:  [facility] [address] [place] - [address] [place] [address] Phone: [phone] Fax: [phone]  Is this the correct pharmacy for this prescription? Yes If no, delete pharmacy and type the correct one.   Has the prescription been filled recently? Yes  Is the patient out of the medication? Has 4 days left   Has the patient been seen for an appointment in the last year OR does the patient have an upcoming appointment? Yes  Can we respond through MyChart? Yes  Agent: Please be advised that Rx refills may take up to 3 business days. We ask that you follow-up with your pharmacy.

## 2024-12-05 ENCOUNTER — Other Ambulatory Visit: Payer: Self-pay | Admitting: Internal Medicine

## 2024-12-05 DIAGNOSIS — F411 Generalized anxiety disorder: Secondary | ICD-10-CM

## 2024-12-06 MED ORDER — FLUOXETINE HCL 20 MG PO CAPS: 20.0000 mg | ORAL_CAPSULE | Freq: Every day | ORAL | 0 refills | Status: DC

## 2024-12-06 NOTE — Telephone Encounter (Signed)
 Requested Prescriptions  Pending Prescriptions Disp Refills   FLUoxetine  (PROZAC ) 20 MG capsule 30 capsule 0    Sig: Take 1 capsule (20 mg total) by mouth daily.     Psychiatry:  Antidepressants - SSRI Passed - 12/06/2024  3:24 PM      Passed - Valid encounter within last 6 months    Recent Outpatient Visits           5 months ago Generalized anxiety disorder   [facility] at [facility], Leita DEL, MD

## 2024-12-09 NOTE — Telephone Encounter (Signed)
 Refilled 12/06/24. Duplicate request.  Requested Prescriptions  Pending Prescriptions Disp Refills   FLUoxetine  (PROZAC ) 20 MG capsule [Pharmacy Med Name: FLUoxetine  HCl 20 MG Oral Capsule] 90 capsule 0    Sig: Take 1 capsule by mouth once daily     Psychiatry:  Antidepressants - SSRI Failed - 12/09/2024  9:12 AM      Failed - Valid encounter within last 6 months    Recent Outpatient Visits           6 months ago Generalized anxiety disorder   [facility] at [facility], Leita DEL, MD

## 2024-12-23 ENCOUNTER — Ambulatory Visit
Admission: RE | Admit: 2024-12-23 | Discharge: 2024-12-23 | Disposition: A | Discharge status: Home / Self Care | Source: Ambulatory Visit | Attending: Internal Medicine | Admitting: Internal Medicine

## 2024-12-23 DIAGNOSIS — Z1231 Encounter for screening mammogram for malignant neoplasm of breast: Secondary | ICD-10-CM | POA: Diagnosis present

## 2024-12-25 ENCOUNTER — Encounter: Admitting: Student

## 2025-01-06 ENCOUNTER — Other Ambulatory Visit: Payer: Self-pay

## 2025-01-06 DIAGNOSIS — F411 Generalized anxiety disorder: Secondary | ICD-10-CM

## 2025-01-06 MED ORDER — FLUOXETINE HCL 20 MG PO CAPS: 20.0000 mg | ORAL_CAPSULE | Freq: Every day | ORAL | 1 refills | Status: AC

## 2025-01-09 ENCOUNTER — Encounter

## 2025-01-22 ENCOUNTER — Ambulatory Visit

## 2025-02-10 ENCOUNTER — Encounter: Admitting: Student

## 2025-02-14 ENCOUNTER — Ambulatory Visit
